# Patient Record
Sex: Male | Born: 1960 | Race: White | Hispanic: No | Marital: Married | Smoking: Never smoker
Health system: Southern US, Community
[De-identification: ages and names within clinical notes are randomized; demographics above are authoritative.]

## PROBLEM LIST (undated history)

## (undated) DIAGNOSIS — K649 Unspecified hemorrhoids: Secondary | ICD-10-CM

## (undated) DIAGNOSIS — D126 Benign neoplasm of colon, unspecified: Secondary | ICD-10-CM

## (undated) DIAGNOSIS — M109 Gout, unspecified: Secondary | ICD-10-CM

## (undated) DIAGNOSIS — K449 Diaphragmatic hernia without obstruction or gangrene: Secondary | ICD-10-CM

## (undated) DIAGNOSIS — E785 Hyperlipidemia, unspecified: Secondary | ICD-10-CM

## (undated) DIAGNOSIS — N200 Calculus of kidney: Secondary | ICD-10-CM

## (undated) DIAGNOSIS — I10 Essential (primary) hypertension: Secondary | ICD-10-CM

## (undated) DIAGNOSIS — K2980 Duodenitis without bleeding: Secondary | ICD-10-CM

## (undated) DIAGNOSIS — E349 Endocrine disorder, unspecified: Secondary | ICD-10-CM

## (undated) DIAGNOSIS — R748 Abnormal levels of other serum enzymes: Secondary | ICD-10-CM

## (undated) HISTORY — DX: Endocrine disorder, unspecified: E34.9

## (undated) HISTORY — DX: Calculus of kidney: N20.0

## (undated) HISTORY — DX: Gout, unspecified: M10.9

## (undated) HISTORY — DX: Hyperlipidemia, unspecified: E78.5

## (undated) HISTORY — DX: Diaphragmatic hernia without obstruction or gangrene: K44.9

## (undated) HISTORY — DX: Duodenitis without bleeding: K29.80

## (undated) HISTORY — DX: Abnormal levels of other serum enzymes: R74.8

## (undated) HISTORY — DX: Benign neoplasm of colon, unspecified: D12.6

## (undated) HISTORY — PX: VASECTOMY: SHX75

## (undated) HISTORY — PX: LITHOTRIPSY: SUR834

## (undated) HISTORY — DX: Unspecified hemorrhoids: K64.9

---

## 2006-01-28 HISTORY — PX: COLONOSCOPY: SHX174

## 2006-06-14 ENCOUNTER — Ambulatory Visit: Payer: Self-pay | Admitting: Cardiology

## 2006-08-16 ENCOUNTER — Ambulatory Visit: Payer: Self-pay | Admitting: Cardiology

## 2010-01-21 ENCOUNTER — Ambulatory Visit (HOSPITAL_COMMUNITY): Admission: RE | Admit: 2010-01-21 | Discharge: 2010-01-21 | Payer: Self-pay | Admitting: Family Medicine

## 2010-09-03 NOTE — Assessment & Plan Note (Signed)
[facility]                            CARDIOLOGY OFFICE NOTE   Jorge [name], Jorge [name]                         MRN:          [id number]  DATE:06/14/2006                            DOB:          04/17/1961    REFERRING PHYSICIAN:  Paulene Floor, NP   REASON FOR REFERRAL:  Evaluate patient with palpitations.   HISTORY OF PRESENT ILLNESS:  The patient is a 50 year old gentleman  without prior cardiac history. He says that last week he noticed  butterflies. This felt like a fluttering in his upper epigastric area.  He had had this for 3-4 days before presenting to [place].  There an EKG demonstrated sinus tachycardia with a heart rate of 100.  There were no other acute abnormalities. He had no presyncope or  syncope. He had no shortness of breath. He denies any chest discomfort,  neck discomfort, arm discomfort, activity-induced nausea, vomiting,  excessive diaphoresis. He never had palpitations like this before. The  patient also was having some problems with his blood pressure being  elevated.   PAST MEDICAL HISTORY:  He has had no prior history of hypertension,  diabetes or hyperlipidemia.   PAST SURGICAL HISTORY:  Vasectomy.   ALLERGIES:  PENICILLIN.   MEDICATIONS:  Metoprolol 25 mg daily.   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 He has one 75-  year-old son. He does not smoke cigarettes. He rarely drinks alcohol.   FAMILY HISTORY:  Noncontributory for early coronary artery disease,  sudden cardiac death, congestive heart failure, arrhythmias, syncope.  His mother does have valve problems. However, she is alive at 59.   REVIEW OF SYSTEMS:  Positive for rare headaches. Negative for all other  systems.   PHYSICAL EXAMINATION:  GENERAL:  The patient is in no distress.  VITAL SIGNS:  Blood pressure 139/86, heart rate 82 and regular, weight  148 pounds, body mass index 24.  HEENT:  Eyelids unremarkable. Pupils equal round  and reactive to light.  Fundi not visualized. Oral mucosa unremarkable.  NECK:  No jugular venous distention, wave form within normal limits,  carotid upstroke brisk and symmetric, no bruits, no thyromegaly.  LYMPHATICS:  No cervical, axillary or inguinal adenopathy.  LUNGS:  Clear to auscultation bilaterally.  BACK:  No costovertebral angle tenderness.  CHEST:  Unremarkable.  HEART:  PMI not displaced or sustained, S1 and S2 within normal limits,  no S3, no S4, no clicks, no rubs, no murmurs.  ABDOMEN:  Flat, positive bowel sounds, normal to frequency and pitch, no  bruits, no rebound, no guarding, no midline pulsatile mass, no  hepatomegaly or splenomegaly.  SKIN:  No rashes, no nodules.  EXTREMITIES:  2+ pulses throughout, no edema, no cyanosis, no clubbing.  NEUROLOGIC:  Oriented to person, placed and time. Cranial nerves II-XII  grossly intact. Motor grossly intact.   EKG sinus rhythm, rate 82, axis within normal limits, interval is within  normal limits. No acute ST-T wave changes. R waves are prime in V1 and  V2.   ASSESSMENT/PLAN:  1. Tachycardia. The patient is having  palpitations that appear to be      sinus. I am going to apply a 2-week event monitor. I am also going      to check a BMET, TSH and magnesium. Further evaluation will be      based on these results. I have also instructed him to stop drinking      caffeine as he does drink a couple of caffeinated beverages every      day. He will continue the metoprolol for now.  2. Followup will be in 4 weeks in [place].     Rollene Rotunda, MD, [facility]  Electronically Signed    JH/MedQ  DD: 06/14/2006  DT: 06/14/2006  Job #: [id number]   cc:   Ernestina Penna, M.D.

## 2010-09-03 NOTE — Assessment & Plan Note (Signed)
[facility]                            CARDIOLOGY OFFICE NOTE   Jorge, [name]                         MRN:          [id number]  DATE:08/16/2006                            DOB:          03/22/1961    PRIMARY CARE PHYSICIAN:  Paulene Floor   REASON FOR PRESENTATION:  Evaluate patient with palpitations.   HISTORY OF PRESENT ILLNESS:  The patient presents for followup. Since  the last visit, he did wear an event monitor. He had some fluttering in  his chest and a funny feeling in his stomach, these were his symptoms.  He pressed the monitor, but had only normal sinus rhythm recorded. He  did stop drinking caffeine and his symptoms went away. He has also been  taking 25 mg of metoprolol. He thinks the metoprolol is interfering with  his sleep and making him sluggish. He is not having pre-syncope or  syncope. He is not having any chest pain. He actually restarted caffeine  recently and still has not been bothered. He is using a much lower dose  than he was before.   PAST MEDICAL HISTORY:  Vasectomy.   ALLERGIES:  PENICILLIN.   MEDICATIONS:  Metoprolol 25 mg daily.   REVIEW OF SYSTEMS:  As stated in the HPI and otherwise negative for  other systems.   PHYSICAL EXAMINATION:  The patient is in no distress. Blood pressure is  124/78, heart rate 79 and regular, weight is 149 pounds.  LUNGS:  Clear to auscultation bilaterally.  HEART: PMI not displaced or sustained, S1 and S2 within normal limits.  No S3, S4. No clicks, rubs, or murmurs.  ABDOMEN: Flat, positive bowel sounds. Normal in frequency and pitch, no  bruits and no rebound. No guarding. No midline pulsatile mass. No  organomegaly.  SKIN: No rashes. No nodules.  EXTREMITIES: 2+ pulses. No edema.   ASSESSMENT/PLAN:  1. Palpitations. At this point, I think the patient can stop his beta-      blocker. There is no clear evidence that his symptoms are related      to any kind of arrhythmia. It  could be GI. I have advised him that      if his palpitations return he needs to stop caffeine again      completely. If this does not work, he could restart the beta-      blocker as we may not have captured palpitations on the monitor. If      it gets worse he will let me know. Of note, he did have normal      chemistry      and thyroid.  2. Followup will be as needed.     Jorge Rotunda, MD, [facility]  Electronically Signed    JH/MedQ  DD: 08/16/2006  DT: 08/16/2006  Job #: [id number]   cc:   Paulene Floor, FNP

## 2011-03-31 ENCOUNTER — Other Ambulatory Visit: Payer: Self-pay

## 2011-03-31 ENCOUNTER — Encounter: Payer: Self-pay | Admitting: Emergency Medicine

## 2011-03-31 ENCOUNTER — Emergency Department (HOSPITAL_COMMUNITY): Payer: BC Managed Care – PPO

## 2011-03-31 ENCOUNTER — Emergency Department (HOSPITAL_COMMUNITY)
Admission: EM | Admit: 2011-03-31 | Discharge: 2011-03-31 | Disposition: A | Discharge status: Home / Self Care | Payer: BC Managed Care – PPO | Attending: Emergency Medicine | Admitting: Emergency Medicine

## 2011-03-31 DIAGNOSIS — R079 Chest pain, unspecified: Secondary | ICD-10-CM | POA: Insufficient documentation

## 2011-03-31 DIAGNOSIS — I160 Hypertensive urgency: Secondary | ICD-10-CM

## 2011-03-31 DIAGNOSIS — R0989 Other specified symptoms and signs involving the circulatory and respiratory systems: Secondary | ICD-10-CM | POA: Insufficient documentation

## 2011-03-31 DIAGNOSIS — R0609 Other forms of dyspnea: Secondary | ICD-10-CM | POA: Insufficient documentation

## 2011-03-31 DIAGNOSIS — Z79899 Other long term (current) drug therapy: Secondary | ICD-10-CM | POA: Insufficient documentation

## 2011-03-31 DIAGNOSIS — I10 Essential (primary) hypertension: Secondary | ICD-10-CM | POA: Insufficient documentation

## 2011-03-31 HISTORY — DX: Essential (primary) hypertension: I10

## 2011-03-31 LAB — CBC
MCH: 31.8 pg (ref 26.0–34.0)
MCHC: 34.7 g/dL (ref 30.0–36.0)
MCV: 91.7 fL (ref 78.0–100.0)
Platelets: 167 10*3/uL (ref 150–400)
RDW: 12.2 % (ref 11.5–15.5)
WBC: 13 10*3/uL — ABNORMAL HIGH (ref 4.0–10.5)

## 2011-03-31 LAB — URINALYSIS, ROUTINE W REFLEX MICROSCOPIC
Bilirubin Urine: NEGATIVE
Hgb urine dipstick: NEGATIVE
Ketones, ur: NEGATIVE mg/dL
Nitrite: NEGATIVE
Protein, ur: NEGATIVE mg/dL
Urobilinogen, UA: 0.2 mg/dL (ref 0.0–1.0)

## 2011-03-31 LAB — BASIC METABOLIC PANEL
Calcium: 9.1 mg/dL (ref 8.4–10.5)
Chloride: 106 mEq/L (ref 96–112)
Creatinine, Ser: 0.98 mg/dL (ref 0.50–1.35)
GFR calc Af Amer: >90 mL/min (ref >90)
GFR calc non Af Amer: >90 mL/min (ref >90)

## 2011-03-31 LAB — TROPONIN I: Troponin I: <0.3 ng/mL (ref <0.30)

## 2011-03-31 NOTE — ED Notes (Signed)
MD gave approval for patient to eat a normal diet tray.  PT offered meal but didn't want anything now.

## 2011-03-31 NOTE — ED Notes (Signed)
Patient at work developed chest pressure and shortness of breath with high blood pressure while applying shrink wrap to a pallet.  Seen at urgent care sent to ED for evaluation.  Aspirin and one nitro sl given at urgent care. Patient denies chest pressure.

## 2011-03-31 NOTE — ED Provider Notes (Signed)
History     CSN: [id number] Arrival date & time: 03/31/2011  2:42 PM   First MD Initiated Contact with Patient 03/31/11 1505      Chief Complaint  Patient presents with  . Chest Pain    (Consider location/radiation/quality/duration/timing/severity/associated sxs/prior treatment) The history is provided by the patient and the spouse.   Patient is a 50 year old male with a history of hypertension who presents with chest pain.  About 2 and half hours prior to arrival, patient was wrapping a palate with plaster. At that time he started developing retrosternal pressure and mild associated dyspnea. He also states he "did not feel right".  He symptoms lasted for about one half hour. There were constant and resolved on their own.. They did not improve when he stopped wrapping the palate.  Patient was checked by nearby clinic and was found to be hypertensive with pressure 190s over low 100s. With this hypertension he noted the symptoms. Since then his blood pressure has improved and he notes complete resolution of the symptoms. He feels at baseline my interview and emergency department. He denied associated lightheadedness, syncope, palpitations. He denies this happening before. He typically has well-controlled hypertension and is on diastolic. He has not missed any recent doses. He denies any over-the-counter medications. He also denies any dietary indiscretion. No illicit drug use.  Past Medical History  Diagnosis Date  . Hypertension     History reviewed. No pertinent past surgical history.  No family history on file. Father had MI in his late 72s or early 25s Mother with mitral valve disorder History  Substance Use Topics  . Smoking status: Never Smoker   . Smokeless tobacco: Not on file  . Alcohol Use: No      Review of Systems  Constitutional: Negative for fever, chills and activity change.  HENT: Negative for congestion and neck pain.   Respiratory: Negative for cough and  wheezing.   Cardiovascular: Negative for chest pain.  Gastrointestinal: Negative for nausea, vomiting, abdominal pain, diarrhea and abdominal distention.  Genitourinary: Negative for difficulty urinating.  Musculoskeletal: Negative for gait problem.  Skin: Negative for rash.  Neurological: Negative for weakness and numbness.  Psychiatric/Behavioral: Negative for behavioral problems and confusion.  All other systems reviewed and are negative.    Allergies  Penicillins  Home Medications   Current Outpatient Rx  Name Route Sig Dispense Refill  . VITAMIN D 1000 UNITS PO TABS Oral Take 2,000 Units by mouth daily.      . OMEGA-3 FATTY ACIDS 1000 MG PO CAPS Oral Take 2,400 mg by mouth daily.      . IBUPROFEN 200 MG PO TABS Oral Take 400 mg by mouth every 6 (six) hours as needed. For pain     . NEBIVOLOL HCL 5 MG PO TABS Oral Take 1.66 mg by mouth daily.     [name] Kitchen ROSUVASTATIN CALCIUM 40 MG PO TABS Oral Take 20 mg by mouth daily.      . TESTOSTERONE 20.25 MG/ACT (1.62%) TD GEL Transdermal Place 2 application onto the skin daily.        BP 131/74  Pulse 76  Temp(Src) 97.4 F (36.3 C) (Oral)  Resp 18  SpO2 97%  Physical Exam  Nursing note and vitals reviewed. Constitutional: He is oriented to person, place, and time. He appears well-developed and well-nourished. No distress.  HENT:  Head: Normocephalic.  Nose: Nose normal.  Eyes: EOM are normal. Pupils are equal, round, and reactive to light.  Neck: Normal range  of motion. Neck supple. No JVD present.  Cardiovascular: Normal rate, regular rhythm and intact distal pulses.   No murmur heard. Pulmonary/Chest: Effort normal and breath sounds normal. No respiratory distress. He exhibits no tenderness.  Abdominal: Soft. Bowel sounds are normal. He exhibits no distension. There is no tenderness.  Musculoskeletal: Normal range of motion. He exhibits no edema and no tenderness.       No calf ttp  Neurological: He is alert and oriented to  person, place, and time.       Normal strength  Skin: Skin is warm and dry. He is not diaphoretic.  Psychiatric: He has a normal mood and affect. His behavior is normal. Thought content normal.    ED Course  Procedures (including critical care time)   Date: 03/31/2011  Rate: 79  Rhythm: normal sinus rhythm  QRS Axis: normal  Intervals: normal  ST/T Wave abnormalities: normal  Conduction Disutrbances:none  Narrative Interpretation: normal EKG  Old EKG Reviewed: none available   Labs Reviewed  CBC - Abnormal; Notable for the following:    WBC 13.0 (*)    All other components within normal limits  BASIC METABOLIC PANEL  TROPONIN I  URINALYSIS, ROUTINE W REFLEX MICROSCOPIC   Dg Chest 2 View  03/31/2011  *RADIOLOGY REPORT*  Clinical Data: Chest pain, hypertension  CHEST - 2 VIEW  Comparison: None.  Findings: The cardiac silhouette, mediastinum, pulmonary vasculature are within normal limits.  There is an azygos lobe which exhibits diffuse increased opacity.  The left lung is clear. There is no acute bony abnormality.  IMPRESSION:  Question fluid or adenopathy in the right paratracheal region within the azygos lobe.  CT scan of the chest may be helpful for better characterization unless there are outside prior films for comparison purposes.  Original Report Authenticated By: Brandon Melnick, M.D.     1. Hypertensive urgency       MDM   Patient with chest pressure and shortness of breath and setting of hypertension. No recent exertional chest pain or dyspnea. Suspect hypertensive urgency. EKG is negative for acute ischemia and hypertrophy. Patient is well appearing with normal vital signs and asymptomatic in ED. Will evaluate for evidence of endorgan damage. Also closely monitor blood pressure. Patient has risk factors for coronary disease including hypertension (very well controlled) and dyslipidemia. He does not smoke. No premature onset of cardiac disease in family.  Lab work  unremarkable. Patient closely monitored and did not have any additional chest pain while in ED. Normal tensive. Clinical picture is consistent with hypertensive urgency. There is no evidence of endorgan damage. Incidental note on chest x-ray relayed to patient. He'll followup with his PCP for outpatient CT to further characterize this. No evidence that this is contributing to patient's symptoms. Return precautions discussed.        Milus Glazier 04/01/11 0016

## 2011-03-31 NOTE — ED Provider Notes (Signed)
4:45 PM  I performed a history and physical examination of Jorge [name] and discussed his management with Dr. Zebedee Iba.  I agree with the history, physical, assessment, and plan of care, with the following exceptions: None  The patient is presenting for evaluation of an episode of dyspnea and chest pressure associated with hypertension that is now resolved. He is in no respiratory distress and breathing easily with good air exchange heard in all lung fields and no wheezes, rales, or rhonchi, normal heart sounds with a normal S1 and S2 no murmur, rub, or gallop. The patient's blood pressure is at this time controlled.  I was present for the following procedures: None Time Spent in Critical Care of the patient: None Time spent in discussions with the patient and family: 5 minutes  Jorge [name] D    Jorge Bonier, MD 03/31/11 [phone]

## 2011-04-01 NOTE — ED Provider Notes (Signed)
  I performed a history and physical examination of Jorge [name] and discussed his management with Dr. Zebedee Iba.  I agree with the history, physical, assessment, and plan of care, with the following exceptions: None  Pt is a 50 year old male with chest pain, hypertensive urgency, both now resolved.  Lungs are clear to auscultation in all fields w/o rales, rhonchi, wheezes, and heart sounds are normal with regular rate and rhythm, no peripheral edema.  I was present for the following procedures: None Time Spent in Critical Care of the patient: None Time spent in discussions with the patient and family: 5 minutes Earl Losee D    Felisa Bonier, MD 04/01/11 1100

## 2011-04-22 ENCOUNTER — Other Ambulatory Visit: Payer: Self-pay | Admitting: Family Medicine

## 2011-04-22 DIAGNOSIS — R9389 Abnormal findings on diagnostic imaging of other specified body structures: Secondary | ICD-10-CM

## 2011-04-26 ENCOUNTER — Ambulatory Visit
Admission: RE | Admit: 2011-04-26 | Discharge: 2011-04-26 | Disposition: A | Discharge status: Home / Self Care | Payer: BC Managed Care – PPO | Source: Ambulatory Visit | Attending: Family Medicine | Admitting: Family Medicine

## 2011-04-26 DIAGNOSIS — R9389 Abnormal findings on diagnostic imaging of other specified body structures: Secondary | ICD-10-CM

## 2011-04-26 MED ORDER — IOHEXOL 300 MG/ML  SOLN
75.0000 mL | Freq: Once | INTRAMUSCULAR | Status: AC | PRN
  Administered 2011-04-26: 75 mL via INTRAVENOUS

## 2012-10-31 ENCOUNTER — Ambulatory Visit (INDEPENDENT_AMBULATORY_CARE_PROVIDER_SITE_OTHER): Payer: BC Managed Care – PPO | Admitting: Family Medicine

## 2012-10-31 ENCOUNTER — Encounter: Payer: Self-pay | Admitting: Family Medicine

## 2012-10-31 VITALS — BP 124/74 | HR 67 | Temp 97.7°F | Ht 64.0 in | Wt 163.2 lb

## 2012-10-31 DIAGNOSIS — I10 Essential (primary) hypertension: Secondary | ICD-10-CM

## 2012-10-31 DIAGNOSIS — E785 Hyperlipidemia, unspecified: Secondary | ICD-10-CM

## 2012-10-31 DIAGNOSIS — E291 Testicular hypofunction: Secondary | ICD-10-CM

## 2012-10-31 DIAGNOSIS — E559 Vitamin D deficiency, unspecified: Secondary | ICD-10-CM

## 2012-10-31 DIAGNOSIS — E349 Endocrine disorder, unspecified: Secondary | ICD-10-CM | POA: Insufficient documentation

## 2012-10-31 NOTE — Progress Notes (Signed)
  Subjective:    Patient ID: Jorge [name], male    DOB: 04-04-1961, 52 y.o.   MRN: [id number]  HPI Patient returns to clinic today for followup and management of his chronic medical problems which include hypertension hyperlipidemia and testosterone deficiency. Labs were drawn today.    Review of Systems  Constitutional: Positive for fatigue (slight).  HENT: Negative.   Eyes: Negative.   Respiratory: Negative.   Cardiovascular: Negative.   Gastrointestinal: Negative.   Endocrine: Negative.   Genitourinary: Negative.   Musculoskeletal: Positive for arthralgias (L shoulder improving).  Skin: Negative.   Allergic/Immunologic: Positive for environmental allergies (seasonal).  Neurological: Negative.   Hematological: Negative.   Psychiatric/Behavioral: Negative.        Objective:   Physical Exam BP 124/74  Pulse 67  Temp(Src) 97.7 F (36.5 C) (Oral)  Ht 5\' 4"  (1.626 m)  Wt 163 lb 3.2 oz (74.027 kg)  BMI 28 kg/m2  The patient appeared well nourished and normally developed, alert and oriented to time and place. Speech, behavior and judgement appear normal. Vital signs as documented.  Head exam is unremarkable. No scleral icterus or pallor noted.  Neck is without jugular venous distension, thyromegally, or carotid bruits. Carotid upstrokes are brisk bilaterally. No cervical adenopathy. Lungs are clear anteriorly and posteriorly to auscultation. Normal respiratory effort. Cardiac exam reveals regular rate and rhythm at 60 per minute. First and second heart sounds normal.  No murmurs, rubs or gallops.  Abdominal exam reveals normal bowl sounds, no masses, no organomegaly and no aortic enlargement. No inguinal adenopathy. Prostate exam today revealed a slightly enlarged but smooth prostate. There were no lumps. There were no rectal masses. Testicles were normal without hernias. Extremities are nonedematous and both femoral pulses are normal. Skin without pallor or jaundice.  Warm  and dry, without rash. He has some healing areas of folliculitis on his buttocks Neurologic exam reveals normal deep tendon reflexes and normal sensation.          Assessment & Plan:  1. Hyperlipidemia - NMR Lipoprofile with Lipids; Standing - Hepatic function panel; Standing  2. Hypertension - POCT CBC; Standing - BASIC METABOLIC PANEL WITH GFR; Standing  3. Testosterone deficiency - POCT urinalysis dipstick - POCT UA - Microscopic Only - PSA; Standing - Testosterone, Total & Free Direct; Standing  4. Vitamin D deficiency - Vitamin D 25 hydroxy; Standing  Patient Instructions  Continue current meds and therapeutic lifestyle changes We will call you with the results once they are available   Nyra Capes MD

## 2012-10-31 NOTE — Patient Instructions (Addendum)
Continue current meds and therapeutic lifestyle changes We will call you with the results once they are available

## 2012-11-01 ENCOUNTER — Other Ambulatory Visit (INDEPENDENT_AMBULATORY_CARE_PROVIDER_SITE_OTHER): Payer: BC Managed Care – PPO

## 2012-11-01 DIAGNOSIS — I10 Essential (primary) hypertension: Secondary | ICD-10-CM

## 2012-11-01 DIAGNOSIS — E559 Vitamin D deficiency, unspecified: Secondary | ICD-10-CM

## 2012-11-01 DIAGNOSIS — E785 Hyperlipidemia, unspecified: Secondary | ICD-10-CM

## 2012-11-01 DIAGNOSIS — E349 Endocrine disorder, unspecified: Secondary | ICD-10-CM

## 2012-11-01 LAB — POCT CBC
Granulocyte percent: 65.5 %G (ref 37–80)
Hemoglobin: 16 g/dL (ref 14.1–18.1)
MCH, POC: 32.2 pg — AB (ref 27–31.2)
MCV: 90.2 fL (ref 80–97)
RBC: 5 M/uL (ref 4.69–6.13)

## 2012-11-01 LAB — BASIC METABOLIC PANEL WITH GFR
CO2: 28 mEq/L (ref 19–32)
Calcium: 9.5 mg/dL (ref 8.4–10.5)
Chloride: 104 mEq/L (ref 96–112)
Creat: 1.03 mg/dL (ref 0.50–1.35)
GFR, Est Non African American: 84 mL/min
Sodium: 141 mEq/L (ref 135–145)

## 2012-11-01 LAB — HEPATIC FUNCTION PANEL
Albumin: 4.3 g/dL (ref 3.5–5.2)
Alkaline Phosphatase: 57 U/L (ref 39–117)
Total Bilirubin: 0.5 mg/dL (ref 0.3–1.2)
Total Protein: 6.4 g/dL (ref 6.0–8.3)

## 2012-11-01 NOTE — Progress Notes (Signed)
Pt came in for labs only 

## 2012-11-02 LAB — NMR LIPOPROFILE WITH LIPIDS
HDL Particle Number: 30.4 umol/L — ABNORMAL LOW (ref >=30.5)
HDL Size: 8.6 nm — ABNORMAL LOW (ref >=9.2)
HDL-C: 34 mg/dL — ABNORMAL LOW (ref >=40)
LDL (calc): 38 mg/dL (ref <100)
LDL Size: 19.9 nm — ABNORMAL LOW (ref >20.5)
LP-IR Score: 74 — ABNORMAL HIGH (ref <=45)
Large HDL-P: <1.3 umol/L — ABNORMAL LOW (ref >=4.8)
Triglycerides: 118 mg/dL (ref <150)

## 2012-11-02 LAB — PSA: PSA: 1.5 ng/mL (ref <=4.00)

## 2012-11-02 LAB — TESTOSTERONE, TOTAL AND FREE DIRECT MEASURE

## 2012-11-02 LAB — VITAMIN D 25 HYDROXY (VIT D DEFICIENCY, FRACTURES): Vit D, 25-Hydroxy: 57 ng/mL (ref 30–89)

## 2012-11-06 NOTE — Addendum Note (Signed)
Addended by: Orma Render F on: 11/06/2012 04:23 PM   Modules accepted: Orders

## 2012-11-06 NOTE — Addendum Note (Signed)
Addended by: Orma Render F on: 11/06/2012 04:25 PM   Modules accepted: Orders

## 2012-11-08 LAB — TESTOSTERONE, TOTAL AND FREE DIRECT MEASURE
Free Testosterone, Direct: 9.5 pg/mL (ref 3.8–34.2)
Testosterone: 278 ng/dL — ABNORMAL LOW (ref 300–890)

## 2012-11-13 ENCOUNTER — Telehealth: Payer: Self-pay | Admitting: *Deleted

## 2012-11-13 NOTE — Telephone Encounter (Signed)
Message copied by Bearl Mulberry on Tue Nov 13, 2012 12:32 PM ------      Message from: Ernestina Penna      Created: Fri Nov 02, 2012  9:30 PM       The total LDL [place] number was at goal at 751. Goal is less than 1000. The LDL C. was good. The HDL particle number, which is the good cholesterol was low.--- Continue current treatment and aggressive therapeutic lifestyle change      The vitamin D level is good      Electrolytes are good. Blood sugar slightly increased at 101. Kidney function tests are good.      The PSA is low and good at 1.50.      All liver function tests within normal limit      The total testosterone level was slightly decreased      The free direct testosterone test was not performed++++++++ please have the lab get this done!!!!!!!---- there will be no change in treatment as patient is doing well and feeling well ------

## 2012-11-13 NOTE — Telephone Encounter (Signed)
Pt notified of results

## 2012-12-04 ENCOUNTER — Other Ambulatory Visit (INDEPENDENT_AMBULATORY_CARE_PROVIDER_SITE_OTHER): Payer: BC Managed Care – PPO

## 2012-12-04 DIAGNOSIS — Z1212 Encounter for screening for malignant neoplasm of rectum: Secondary | ICD-10-CM

## 2012-12-07 ENCOUNTER — Encounter: Payer: Self-pay | Admitting: *Deleted

## 2013-01-02 ENCOUNTER — Other Ambulatory Visit: Payer: Self-pay | Admitting: *Deleted

## 2013-01-02 MED ORDER — NEBIVOLOL HCL 5 MG PO TABS: 5.0000 mg | ORAL_TABLET | Freq: Every day | ORAL | Status: DC

## 2013-01-02 NOTE — Telephone Encounter (Signed)
LAST RF 7/14. EPIC MED LIST HAD PT TAKE 1/2 QD BUT REFILL REQUEST WAS FOR ONE QD.

## 2013-03-07 ENCOUNTER — Ambulatory Visit (INDEPENDENT_AMBULATORY_CARE_PROVIDER_SITE_OTHER): Payer: PRIVATE HEALTH INSURANCE | Admitting: Family Medicine

## 2013-03-07 ENCOUNTER — Encounter (INDEPENDENT_AMBULATORY_CARE_PROVIDER_SITE_OTHER): Payer: Self-pay

## 2013-03-07 ENCOUNTER — Encounter: Payer: Self-pay | Admitting: Family Medicine

## 2013-03-07 VITALS — BP 146/81 | HR 76 | Temp 98.9°F | Ht 64.0 in | Wt 163.0 lb

## 2013-03-07 DIAGNOSIS — E559 Vitamin D deficiency, unspecified: Secondary | ICD-10-CM

## 2013-03-07 DIAGNOSIS — E785 Hyperlipidemia, unspecified: Secondary | ICD-10-CM

## 2013-03-07 DIAGNOSIS — E349 Endocrine disorder, unspecified: Secondary | ICD-10-CM

## 2013-03-07 DIAGNOSIS — I10 Essential (primary) hypertension: Secondary | ICD-10-CM

## 2013-03-07 DIAGNOSIS — B029 Zoster without complications: Secondary | ICD-10-CM

## 2013-03-07 DIAGNOSIS — E291 Testicular hypofunction: Secondary | ICD-10-CM

## 2013-03-07 MED ORDER — ROSUVASTATIN CALCIUM 40 MG PO TABS: 40.0000 mg | ORAL_TABLET | Freq: Every day | ORAL | Status: DC

## 2013-03-07 MED ORDER — NEBIVOLOL HCL 5 MG PO TABS: 5.0000 mg | ORAL_TABLET | Freq: Every day | ORAL | Status: DC

## 2013-03-07 MED ORDER — VALACYCLOVIR HCL 1 G PO TABS: 1000.0000 mg | ORAL_TABLET | Freq: Three times a day (TID) | ORAL | Status: DC

## 2013-03-07 MED ORDER — TESTOSTERONE 20.25 MG/ACT (1.62%) TD GEL: 2.0000 "application " | Freq: Every day | TRANSDERMAL | Status: DC

## 2013-03-07 NOTE — Progress Notes (Signed)
Subjective:    Patient ID: Jorge [name], male    DOB: 08/16/1960, 52 y.o.   MRN: [id number]  HPI Pt here for follow up and management of chronic medical problems. Patient does complain of some headaches. He also indicates that his blood pressures have been running slightly higher. He is still using the AndroGel regularly, 2 pumps once daily. 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 He has been under more stress. He has had some neck pain and has taken some anti-inflammatory medications over-the-counter. He does straight some caffeine and carbonated beverages. He recently developed a nonpainful rash on his left flank this started 2 days ago. He notices it tingles and he is aware of this they are but once again there is no pain associated with this.     Patient Active Problem List   Diagnosis Date Noted  . Hyperlipidemia 10/31/2012  . Hypertension 10/31/2012  . Testosterone deficiency 10/31/2012  . Vitamin D deficiency 10/31/2012   Outpatient Encounter Prescriptions as of 03/07/2013  Medication Sig  . cholecalciferol (VITAMIN D) 1000 UNITS tablet Take 2,000 Units by mouth daily.    . fish oil-omega-3 fatty acids 1000 MG capsule Take 2,400 mg by mouth daily.    [name] Kitchen ibuprofen (ADVIL,MOTRIN) 200 MG tablet Take 400 mg by mouth every 6 (six) hours as needed. For pain   . nebivolol (BYSTOLIC) 5 MG tablet Take 1 tablet (5 mg total) by mouth daily.  . rosuvastatin (CRESTOR) 40 MG tablet Take 20 mg by mouth daily.    . Testosterone (ANDROGEL PUMP) 20.25 MG/ACT (1.62%) GEL Place 2 application onto the skin daily.      Review of Systems  Constitutional: Negative.   Eyes: Negative.   Respiratory: Negative.   Cardiovascular: Negative.   Gastrointestinal: Negative.   Endocrine: Negative.   Genitourinary: Negative.   Musculoskeletal: Negative.   Skin: Positive for rash (low back rash- first noticed 1.5 days ago).  Allergic/Immunologic: Negative.   Neurological:  Positive for headaches (BP running higher). Negative for dizziness.  Hematological: Negative.   Psychiatric/Behavioral: Negative.        Objective:   Physical Exam  Nursing note and vitals reviewed. Constitutional: He is oriented to person, place, and time. He appears well-developed and well-nourished. No distress.  HENT:  Head: Normocephalic and atraumatic.  Right Ear: External ear normal.  Left Ear: External ear normal.  Nose: Nose normal.  Mouth/Throat: Oropharynx is clear and moist. No oropharyngeal exudate.  Eyes: Conjunctivae and EOM are normal. Pupils are equal, round, and reactive to light. Right eye exhibits no discharge. Left eye exhibits no discharge. No scleral icterus.  Neck: Normal range of motion. Neck supple. No tracheal deviation present. No thyromegaly present.  Cardiovascular: Normal rate, regular rhythm, normal heart sounds and intact distal pulses.  Exam reveals no gallop and no friction rub.   No murmur heard. Pulmonary/Chest: Effort normal and breath sounds normal. No respiratory distress. He has no wheezes. He has no rales. He exhibits no tenderness.  Abdominal: Soft. Bowel sounds are normal. He exhibits no mass. There is no tenderness. There is no rebound and no guarding.  Musculoskeletal: Normal range of motion. He exhibits no edema and no tenderness.  Lymphadenopathy:    He has no cervical adenopathy.  Neurological: He is alert and oriented to person, place, and time. He has normal reflexes. No cranial nerve deficit.  Skin: Skin is warm and dry. No rash noted. No erythema. No pallor.  Psychiatric:  He has a normal mood and affect. His behavior is normal. Judgment and thought content normal.   BP 146/81  Pulse 76  Temp(Src) 98.9 F (37.2 C) (Oral)  Ht 5\' 4"  (1.626 m)  Wt 163 lb (73.936 kg)  BMI 27.97 kg/m2        Assessment & Plan:   1. Hypertension   2. Hyperlipidemia   3. Testosterone deficiency   4. Vitamin D deficiency   5. Shingles outbreak     Orders Placed This Encounter  Procedures  . DG Chest 2 View    Standing Status: Future     Number of Occurrences:      Standing Expiration Date: 05/07/2014    Order Specific Question:  Reason for Exam (SYMPTOM  OR DIAGNOSIS REQUIRED)    Answer:  htn    Order Specific Question:  Preferred imaging location?    Answer:  Internal  . Hepatic function panel    Standing Status: Future     Number of Occurrences:      Standing Expiration Date: 03/07/2014  . BMP8+EGFR    Standing Status: Future     Number of Occurrences:      Standing Expiration Date: 03/07/2014  . NMR, lipoprofile    Standing Status: Future     Number of Occurrences:      Standing Expiration Date: 03/07/2014  . Vit D  25 hydroxy (rtn osteoporosis monitoring)    Standing Status: Future     Number of Occurrences:      Standing Expiration Date: 03/07/2014  . POCT CBC    Standing Status: Future     Number of Occurrences:      Standing Expiration Date: 03/07/2014   Meds ordered this encounter  Medications  . Testosterone (ANDROGEL PUMP) 20.25 MG/ACT (1.62%) GEL    Sig: Place 2 application onto the skin daily.    Dispense:  150 g    Refill:  1  . rosuvastatin (CRESTOR) 40 MG tablet    Sig: Take 1 tablet (40 mg total) by mouth daily. Take one tab by mouth once a day as directed.    Dispense:  62 tablet    Refill:  3  . nebivolol (BYSTOLIC) 5 MG tablet    Sig: Take 1 tablet (5 mg total) by mouth daily.    Dispense:  62 tablet    Refill:  3   Valtrex 1 g 3 times daily for 10 days  Patient Instructions  Continue current medications. Continue good therapeutic lifestyle changes which include good diet and exercise. Fall precautions discussed with patient. If you are over 15 years old - you may need Prevnar 13 or the adult Pneumonia vaccine. Take medication as directed Try to drink more water and less caffeine and less carbonated beverages  Avoid NSAIDs Try to relax when your home Call in one week regarding the  rash that you have and let us make sure that it is getting better If your blood pressures remain elevated in the 140 range, add 2 1/2 milligrams after 2 weeks, which would be a total of 5 mg daily; during these blood pressures by for review in about 4 weeks Return to clinic for future lab work in January and a chest x-ray I will see you again sometime in March or April   Nyra Capes MD

## 2013-03-07 NOTE — Patient Instructions (Addendum)
Continue current medications. Continue good therapeutic lifestyle changes which include good diet and exercise. Fall precautions discussed with patient. If you are over 51 years old - you may need Prevnar 13 or the adult Pneumonia vaccine. Take medication as directed Try to drink more water and less caffeine and less carbonated beverages  Avoid NSAIDs Try to relax when your home Call in one week regarding the rash that you have and let us make sure that it is getting better If your blood pressures remain elevated in the 140 range, add 2 1/2 milligrams after 2 weeks, which would be a total of 5 mg daily; during these blood pressures by for review in about 4 weeks Return to clinic for future lab work in January and a chest x-ray I will see you again sometime in March or April

## 2013-03-07 NOTE — Addendum Note (Signed)
Addended by: Magdalene River on: 03/07/2013 04:35 PM   Modules accepted: Orders

## 2013-03-29 ENCOUNTER — Ambulatory Visit: Payer: PRIVATE HEALTH INSURANCE

## 2013-03-29 DIAGNOSIS — Z23 Encounter for immunization: Secondary | ICD-10-CM

## 2013-06-17 ENCOUNTER — Encounter: Payer: Self-pay | Admitting: *Deleted

## 2013-07-17 ENCOUNTER — Telehealth: Payer: Self-pay | Admitting: *Deleted

## 2013-07-17 ENCOUNTER — Telehealth: Payer: Self-pay | Admitting: Family Medicine

## 2013-07-17 ENCOUNTER — Ambulatory Visit (INDEPENDENT_AMBULATORY_CARE_PROVIDER_SITE_OTHER): Payer: PRIVATE HEALTH INSURANCE | Admitting: Family Medicine

## 2013-07-17 ENCOUNTER — Encounter: Payer: Self-pay | Admitting: Family Medicine

## 2013-07-17 VITALS — BP 147/87 | HR 73 | Temp 98.7°F | Ht 64.0 in | Wt 160.6 lb

## 2013-07-17 DIAGNOSIS — R52 Pain, unspecified: Secondary | ICD-10-CM

## 2013-07-17 DIAGNOSIS — R109 Unspecified abdominal pain: Secondary | ICD-10-CM

## 2013-07-17 DIAGNOSIS — N200 Calculus of kidney: Secondary | ICD-10-CM

## 2013-07-17 LAB — POCT URINALYSIS DIPSTICK
Bilirubin, UA: NEGATIVE
Glucose, UA: NEGATIVE
Ketones, UA: NEGATIVE
Leukocytes, UA: NEGATIVE
Nitrite, UA: NEGATIVE
Protein, UA: NEGATIVE
Spec Grav, UA: 1.025
Urobilinogen, UA: NEGATIVE
pH, UA: 5

## 2013-07-17 LAB — POCT UA - MICROSCOPIC ONLY
Casts, Ur, LPF, POC: NEGATIVE
Crystals, Ur, HPF, POC: NEGATIVE
Mucus, UA: NEGATIVE
Yeast, UA: NEGATIVE

## 2013-07-17 MED ORDER — TAMSULOSIN HCL 0.4 MG PO CAPS: 0.4000 mg | ORAL_CAPSULE | Freq: Every day | ORAL | Status: DC

## 2013-07-17 MED ORDER — HYDROCODONE-ACETAMINOPHEN 10-325 MG PO TABS: 1.0000 | ORAL_TABLET | Freq: Three times a day (TID) | ORAL | Status: DC | PRN

## 2013-07-17 NOTE — Telephone Encounter (Signed)
Pt appt given

## 2013-07-17 NOTE — Telephone Encounter (Signed)
RX should have been sent into [facility] in [place] of mail order [facility] Pt notified

## 2013-07-17 NOTE — Progress Notes (Signed)
   Subjective:    Patient ID: [name], male    DOB: 08/11/1960, 53 y.o.   MRN: [id number]  HPI  This 53 y.o. male presents for evaluation of right flank pain and colicky pain.  The pain is similar to The pain when he gets a kidney stone.  He has been having some problems with his flow at times But was able to void and provide a ua sample.  Review of Systems C/o right flank pain   No chest pain, SOB, HA, dizziness, vision change, N/V, diarrhea, constipation, dysuria, urinary urgency or frequency, myalgias, arthralgias or rash.  Objective:   Physical Exam  Vital signs noted  Well developed well nourished male.  HEENT - Head atraumatic Normocephalic                Eyes - PERRLA, Conjuctiva - clear Sclera- Clear EOMI                Ears - EAC's Wnl TM's Wnl Gross Hearing WNL                Nose - Nares patent                 Throat - oropharanx wnl Respiratory - Lungs CTA bilateral Cardiac - RRR S1 and S2 without murmur GI - Abdomen soft Nontender and bowel sounds active x 4 Extremities - No edema. Neuro - Grossly intact.      Assessment & Plan:  Flank pain, acute - Plan: POCT urinalysis dipstick, POCT UA - Microscopic Only, tamsulosin (FLOMAX) 0.4 MG CAPS capsule, HYDROcodone-acetaminophen (NORCO) 10-325 MG per tablet  Kidney stones - Plan: tamsulosin (FLOMAX) 0.4 MG CAPS capsule, HYDROcodone-acetaminophen (NORCO) 10-325 MG per tablet  Lysbeth Penner FNP

## 2013-07-22 ENCOUNTER — Telehealth: Payer: Self-pay | Admitting: Family Medicine

## 2013-07-23 ENCOUNTER — Telehealth: Payer: Self-pay | Admitting: Family Medicine

## 2013-07-23 NOTE — Telephone Encounter (Signed)
Wants to see about getting referral to urologist for kidney stone

## 2013-07-23 NOTE — Telephone Encounter (Signed)
U/A was done in office and should have been discussed at visit. There are no notes on the results but they do seem to correlate with diagnosis of kidney stone. Left this information on patient's voicemail per request.

## 2013-07-25 ENCOUNTER — Other Ambulatory Visit: Payer: Self-pay | Admitting: Family Medicine

## 2013-07-25 DIAGNOSIS — N200 Calculus of kidney: Secondary | ICD-10-CM

## 2013-07-25 NOTE — Telephone Encounter (Signed)
Spoke with pt and he is aware referral has been initiated . Advised pt to call back tomorrow if not heard from [place]

## 2013-07-25 NOTE — Telephone Encounter (Signed)
Consult to urology ordered urgent

## 2013-07-30 ENCOUNTER — Telehealth: Payer: Self-pay | Admitting: Family Medicine

## 2013-08-05 ENCOUNTER — Ambulatory Visit: Payer: PRIVATE HEALTH INSURANCE | Admitting: Family Medicine

## 2013-08-06 ENCOUNTER — Ambulatory Visit (INDEPENDENT_AMBULATORY_CARE_PROVIDER_SITE_OTHER): Payer: PRIVATE HEALTH INSURANCE | Admitting: Family Medicine

## 2013-08-06 ENCOUNTER — Other Ambulatory Visit: Payer: Self-pay | Admitting: Family Medicine

## 2013-08-06 ENCOUNTER — Ambulatory Visit (INDEPENDENT_AMBULATORY_CARE_PROVIDER_SITE_OTHER): Payer: PRIVATE HEALTH INSURANCE

## 2013-08-06 ENCOUNTER — Encounter: Payer: Self-pay | Admitting: Family Medicine

## 2013-08-06 VITALS — BP 156/90 | HR 72 | Temp 98.9°F | Ht 64.0 in | Wt 154.0 lb

## 2013-08-06 DIAGNOSIS — I10 Essential (primary) hypertension: Secondary | ICD-10-CM

## 2013-08-06 DIAGNOSIS — E291 Testicular hypofunction: Secondary | ICD-10-CM

## 2013-08-06 DIAGNOSIS — Z0000 Encounter for general adult medical examination without abnormal findings: Secondary | ICD-10-CM

## 2013-08-06 DIAGNOSIS — N201 Calculus of ureter: Secondary | ICD-10-CM

## 2013-08-06 DIAGNOSIS — E785 Hyperlipidemia, unspecified: Secondary | ICD-10-CM

## 2013-08-06 DIAGNOSIS — E559 Vitamin D deficiency, unspecified: Secondary | ICD-10-CM

## 2013-08-06 DIAGNOSIS — E349 Endocrine disorder, unspecified: Secondary | ICD-10-CM

## 2013-08-06 DIAGNOSIS — N400 Enlarged prostate without lower urinary tract symptoms: Secondary | ICD-10-CM

## 2013-08-06 DIAGNOSIS — E8881 Metabolic syndrome: Secondary | ICD-10-CM | POA: Insufficient documentation

## 2013-08-06 NOTE — Progress Notes (Signed)
Subjective:    Patient ID: Jorge [name], male    DOB: 12/08/1960, 53 y.o.   MRN: [id number]  HPI Pt here for follow up and management of chronic medical problems.         Patient Active Problem List   Diagnosis Date Noted  . Hyperlipidemia 10/31/2012  . Hypertension 10/31/2012  . Testosterone deficiency 10/31/2012  . Vitamin D deficiency 10/31/2012   Outpatient Encounter Prescriptions as of 08/06/2013  Medication Sig  . cholecalciferol (VITAMIN D) 1000 UNITS tablet Take 2,000 Units by mouth daily.    . fish oil-omega-3 fatty acids 1000 MG capsule Take 2,400 mg by mouth daily.    [name] Kitchen HYDROcodone-acetaminophen (NORCO) 10-325 MG per tablet Take 1 tablet by mouth every 8 (eight) hours as needed.  [name] Kitchen ibuprofen (ADVIL,MOTRIN) 200 MG tablet Take 400 mg by mouth every 6 (six) hours as needed. For pain   . nebivolol (BYSTOLIC) 5 MG tablet Take 1 tablet (5 mg total) by mouth daily.  . rosuvastatin (CRESTOR) 40 MG tablet Take 1 tablet (40 mg total) by mouth daily. Take one tab by mouth once a day as directed.  . tamsulosin (FLOMAX) 0.4 MG CAPS capsule Take 1 capsule (0.4 mg total) by mouth daily.  . Testosterone (ANDROGEL PUMP) 20.25 MG/ACT (1.62%) GEL Place 2 application onto the skin daily.    Review of Systems  Constitutional: Negative.   HENT: Negative.   Eyes: Negative.   Respiratory: Negative.   Cardiovascular: Negative.   Gastrointestinal: Negative.   Endocrine: Negative.   Genitourinary: Negative.        Kidney stone  Musculoskeletal: Negative.   Skin: Negative.   Allergic/Immunologic: Negative.   Neurological: Negative.   Hematological: Negative.   Psychiatric/Behavioral: Negative.    no fatigue or sexual dysfunction noted just stressed with the kidney stone that he is unable to pass.     Objective:   Physical Exam  Nursing note and vitals reviewed. Constitutional: He is oriented to person, place, and time. He appears well-developed and well-nourished. No distress.   HENT:  Head: Normocephalic and atraumatic.  Right Ear: External ear normal.  Left Ear: External ear normal.  Nose: Nose normal.  Mouth/Throat: Oropharynx is clear and moist. No oropharyngeal exudate.  Eyes: Conjunctivae and EOM are normal. Pupils are equal, round, and reactive to light. Right eye exhibits no discharge. Left eye exhibits no discharge. No scleral icterus.  Neck: Normal range of motion. Neck supple. No thyromegaly present.  No carotid bruits  Cardiovascular: Normal rate, regular rhythm, normal heart sounds and intact distal pulses.  Exam reveals no gallop and no friction rub.   No murmur heard. At 72 per minute  Pulmonary/Chest: Effort normal and breath sounds normal. He has no wheezes. He has no rales. He exhibits no tenderness.  No axillary nodes  Abdominal: Soft. Bowel sounds are normal. He exhibits no mass. There is no tenderness. There is no rebound and no guarding.  Genitourinary: Rectum normal and penis normal.  The prostate was slightly enlarged without any masses or nodules. There were no rectal masses. There is no inguinal hernia palpated. External genitalia were within normal limits.  Musculoskeletal: Normal range of motion. He exhibits no edema and no tenderness.  Lymphadenopathy:    He has no cervical adenopathy.  Neurological: He is alert and oriented to person, place, and time. He has normal reflexes. No cranial nerve deficit.  Skin: Skin is warm and dry. No rash noted. No erythema. No pallor.  Psychiatric: He  has a normal mood and affect. His behavior is normal. Judgment and thought content normal.   BP 156/90  Pulse 72  Temp(Src) 98.9 F (37.2 C) (Oral)  Ht '5\' 4"'  (1.626 m)  Wt 154 lb (69.854 kg)  BMI 26.42 kg/m2  [facility] reading (PRIMARY) by  Dr.Moore-chest x-ray- no active disease                                      Assessment & Plan:  1. Hyperlipidemia - POCT CBC; Future - NMR, lipoprofile; Future  2. Hypertension - POCT CBC; Future -  BMP8+EGFR; Future - Hepatic function panel; Future  3. Testosterone deficiency - POCT CBC; Future - PSA, total and free; Future - Testosterone,Free and Total; Future - POCT UA - Microscopic Only - POCT urinalysis dipstick  4. Vitamin D deficiency - POCT CBC; Future - Vit D  25 hydroxy (rtn osteoporosis monitoring); Future  5. Metabolic syndrome - POCT CBC; Future - POCT glycosylated hemoglobin (Hb A1C); Future  6. Right ureteral stone  7. Benign hypertrophy of prostate Patient Instructions  Continue current medications. Continue good therapeutic lifestyle changes which include good diet and exercise. Fall precautions discussed with patient. If an FOBT was given today- please return it to our front desk. If you are over 34 years old - you may need Prevnar 34 or the adult Pneumonia vaccine.  Return to clinic in a couple of weeks for fasting lab work including testosterone and PSA Check with her insurance regarding the Prevnar vaccine and a shingle shot Keep drinking plenty of fluids We will call him with the lab work results once those results are available Brain blood pressure readings in 2 weeks when you get your lab work done Have nurse to check your blood pressure in this office   Arrie Senate MD

## 2013-08-06 NOTE — Addendum Note (Signed)
Addended by: Pollyann Kennedy F on: 08/06/2013 06:01 PM   Modules accepted: Orders

## 2013-08-06 NOTE — Patient Instructions (Addendum)
Continue current medications. Continue good therapeutic lifestyle changes which include good diet and exercise. Fall precautions discussed with patient. If an FOBT was given today- please return it to our front desk. If you are over 52 years old - you may need Prevnar 35 or the adult Pneumonia vaccine.  Return to clinic in a couple of weeks for fasting lab work including testosterone and PSA Check with her insurance regarding the Prevnar vaccine and a shingle shot Keep drinking plenty of fluids We will call him with the lab work results once those results are available Brain blood pressure readings in 2 weeks when you get your lab work done Have nurse to check your blood pressure in this office

## 2013-08-07 LAB — MICROSCOPIC EXAMINATION
Bacteria, UA: NONE SEEN
EPITHELIAL CELLS (NON RENAL): NONE SEEN /HPF (ref 0–10)

## 2013-08-07 LAB — URINALYSIS, COMPLETE
BILIRUBIN UA: NEGATIVE
GLUCOSE, UA: NEGATIVE
Ketones, UA: NEGATIVE
Leukocytes, UA: NEGATIVE
Nitrite, UA: NEGATIVE
Protein, UA: NEGATIVE
RBC, UA: NEGATIVE
Specific Gravity, UA: 1.016 (ref 1.005–1.030)
Urobilinogen, Ur: 0.2 mg/dL (ref 0.0–1.9)
pH, UA: 6 (ref 5.0–7.5)

## 2013-08-22 ENCOUNTER — Telehealth: Payer: Self-pay | Admitting: Family Medicine

## 2013-08-22 NOTE — Telephone Encounter (Signed)
Patient can wait until this additional procedure is completed and then get his lab work

## 2013-08-22 NOTE — Telephone Encounter (Signed)
Wife Joy- aware

## 2014-02-13 ENCOUNTER — Ambulatory Visit (INDEPENDENT_AMBULATORY_CARE_PROVIDER_SITE_OTHER): Payer: PRIVATE HEALTH INSURANCE | Admitting: Family Medicine

## 2014-02-13 ENCOUNTER — Encounter: Payer: Self-pay | Admitting: Family Medicine

## 2014-02-13 VITALS — BP 140/88 | HR 68 | Temp 98.1°F | Ht 64.0 in | Wt 159.0 lb

## 2014-02-13 DIAGNOSIS — Z23 Encounter for immunization: Secondary | ICD-10-CM

## 2014-02-13 DIAGNOSIS — E785 Hyperlipidemia, unspecified: Secondary | ICD-10-CM

## 2014-02-13 DIAGNOSIS — E8881 Metabolic syndrome: Secondary | ICD-10-CM

## 2014-02-13 DIAGNOSIS — I10 Essential (primary) hypertension: Secondary | ICD-10-CM

## 2014-02-13 DIAGNOSIS — N200 Calculus of kidney: Secondary | ICD-10-CM

## 2014-02-13 DIAGNOSIS — E291 Testicular hypofunction: Secondary | ICD-10-CM

## 2014-02-13 DIAGNOSIS — E349 Endocrine disorder, unspecified: Secondary | ICD-10-CM

## 2014-02-13 DIAGNOSIS — E559 Vitamin D deficiency, unspecified: Secondary | ICD-10-CM

## 2014-02-13 DIAGNOSIS — R21 Rash and other nonspecific skin eruption: Secondary | ICD-10-CM

## 2014-02-13 NOTE — Progress Notes (Signed)
Subjective:    Patient ID: Jorge [name], male    DOB: Dec 24, 1960, 53 y.o.   MRN: [id number]  HPI Pt here for follow up and management of chronic medical problems. The patient comes in today for his routine follow-up. He has a history of testosterone deficiency. More significantly he is been followed for his nephrolithiasis and multiple lithotripsies. According to the patient he still has some small stones in his right kidney that are passable. He will get his flu shot today and will check with his insurance regarding the Prevnar vaccine. He is going to return tomorrow for lab work. He will also be given an FOBT to return. The patient indicates his blood pressure is been good at home and he complains of a rash on his left elbow. Outside blood pressures were brought in for review and the majority of these are well within the normal range.         Patient Active Problem List   Diagnosis Date Noted  . Metabolic syndrome 93/79/0240  . Hyperlipidemia 10/31/2012  . Hypertension 10/31/2012  . Testosterone deficiency 10/31/2012  . Vitamin D deficiency 10/31/2012   Outpatient Encounter Prescriptions as of 02/13/2014  Medication Sig  . cholecalciferol (VITAMIN D) 1000 UNITS tablet Take 2,000 Units by mouth daily.    . fish oil-omega-3 fatty acids 1000 MG capsule Take 2,400 mg by mouth daily.    [name] Kitchen HYDROcodone-acetaminophen (NORCO) 10-325 MG per tablet Take 1 tablet by mouth every 8 (eight) hours as needed.  [name] Kitchen ibuprofen (ADVIL,MOTRIN) 200 MG tablet Take 400 mg by mouth every 6 (six) hours as needed. For pain   . Magnesium 250 MG TABS Take 0.5 tablets by mouth daily.  . nebivolol (BYSTOLIC) 5 MG tablet Take 1 tablet (5 mg total) by mouth daily.  . rosuvastatin (CRESTOR) 40 MG tablet Take 1 tablet (40 mg total) by mouth daily. Take one tab by mouth once a day as directed.  . tamsulosin (FLOMAX) 0.4 MG CAPS capsule Take 1 capsule (0.4 mg total) by mouth daily.  . Testosterone (ANDROGEL PUMP) 20.25  MG/ACT (1.62%) GEL Place 2 application onto the skin daily.    Review of Systems  Constitutional: Negative.   HENT: Negative.   Eyes: Negative.   Respiratory: Negative.   Cardiovascular: Negative.   Gastrointestinal: Negative.   Endocrine: Negative.   Genitourinary: Negative.   Musculoskeletal: Negative.   Skin: Positive for rash (left elbow).  Allergic/Immunologic: Negative.   Neurological: Negative.   Hematological: Negative.   Psychiatric/Behavioral: Negative.        Objective:   Physical Exam  Nursing note and vitals reviewed. Constitutional: He is oriented to person, place, and time. He appears well-developed and well-nourished. No distress.  HENT:  Head: Normocephalic and atraumatic.  Right Ear: External ear normal.  Left Ear: External ear normal.  Mouth/Throat: Oropharynx is clear and moist. No oropharyngeal exudate.  Nasal congestion  Eyes: Conjunctivae and EOM are normal. Pupils are equal, round, and reactive to light. Right eye exhibits no discharge. Left eye exhibits no discharge. No scleral icterus.  Neck: Normal range of motion. Neck supple. No thyromegaly present.  No carotid bruits or cervical adenopathy  Cardiovascular: Normal rate, regular rhythm, normal heart sounds and intact distal pulses.  Exam reveals no gallop and no friction rub.   No murmur heard. At 72/m  Pulmonary/Chest: Effort normal and breath sounds normal. No respiratory distress. He has no wheezes. He has no rales. He exhibits no tenderness.  Lungs are clear  with no axillary adenopathy  Abdominal: Soft. Bowel sounds are normal. He exhibits no mass. There is no tenderness. There is no rebound and no guarding.  No masses or tenderness  Genitourinary:  The prostate exam was not done today as this patient has had one in the past 2 months and we will wait and do it at the next visit.  Musculoskeletal: Normal range of motion. He exhibits no edema and no tenderness.  Lymphadenopathy:    He has no  cervical adenopathy.  Neurological: He is alert and oriented to person, place, and time. He has normal reflexes. No cranial nerve deficit.  Skin: Skin is warm and dry. Rash noted. No erythema. No pallor.  The patient has a lumpy type rash on his left elbow which appears to be warty in nature.-We will do a referral to the dermatologist for biopsy and treatment.  Psychiatric: He has a normal mood and affect. His behavior is normal. Judgment and thought content normal.   BP 140/88  Pulse 68  Temp(Src) 98.1 F (36.7 C) (Oral)  Ht 5\' 4"  (1.626 m)  Wt 159 lb (72.122 kg)  BMI 27.28 kg/m2        Assessment & Plan:  1. Hyperlipidemia  2. Essential hypertension  3. Metabolic syndrome  4. Testosterone deficiency  5. Vitamin D deficiency  6. Nephrolithiasis - Magnesium; Future  7. Rash - Ambulatory referral to Dermatology  Meds ordered this encounter  Medications  . Magnesium 250 MG TABS    Sig: Take 0.5 tablets by mouth daily.   Patient Instructions  Continue current medications. Continue good therapeutic lifestyle changes which include good diet and exercise. Fall precautions discussed with patient. If an FOBT was given today- please return it to our front desk. If you are over 15 years old - you may need Prevnar 72 or the adult Pneumonia vaccine.  Flu Shots will be available at our office starting mid- September. Please call and schedule a FLU CLINIC APPOINTMENT.   Continue to drink plenty of fluids and take current medications bring blood pressure readings by and bring your monitor by and we will compare your reading to a reading with our monitor to make sure that your monitor is correct. Continue to watch sodium intake Try to watch carbohydrates and sweets more closely Try to stay awake in the evening instead of taking evening so that you will rest better at nighttime For now continue the magnesium as your doing and we will check the level with your blood work to see if  we need to increase the dosage anymore. Check with her insurance regarding the Prevnar vaccine Return the FOBT We will do a referral to a dermatologist  for the left elbow Continue follow-up with urology for kidney stones   Arrie Senate MD

## 2014-02-13 NOTE — Patient Instructions (Addendum)
Continue current medications. Continue good therapeutic lifestyle changes which include good diet and exercise. Fall precautions discussed with patient. If an FOBT was given today- please return it to our front desk. If you are over 52 years old - you may need Prevnar 24 or the adult Pneumonia vaccine.  Flu Shots will be available at our office starting mid- September. Please call and schedule a FLU CLINIC APPOINTMENT.   Continue to drink plenty of fluids and take current medications bring blood pressure readings by and bring your monitor by and we will compare your reading to a reading with our monitor to make sure that your monitor is correct. Continue to watch sodium intake Try to watch carbohydrates and sweets more closely Try to stay awake in the evening instead of taking evening so that you will rest better at nighttime For now continue the magnesium as your doing and we will check the level with your blood work to see if we need to increase the dosage anymore. Check with her insurance regarding the Prevnar vaccine Return the FOBT We will do a referral to a dermatologist  for the left elbow Continue follow-up with urology for kidney stones

## 2014-02-14 ENCOUNTER — Other Ambulatory Visit (INDEPENDENT_AMBULATORY_CARE_PROVIDER_SITE_OTHER): Payer: PRIVATE HEALTH INSURANCE

## 2014-02-14 DIAGNOSIS — E785 Hyperlipidemia, unspecified: Secondary | ICD-10-CM

## 2014-02-14 DIAGNOSIS — N200 Calculus of kidney: Secondary | ICD-10-CM

## 2014-02-14 DIAGNOSIS — E559 Vitamin D deficiency, unspecified: Secondary | ICD-10-CM

## 2014-02-14 DIAGNOSIS — E291 Testicular hypofunction: Secondary | ICD-10-CM

## 2014-02-14 DIAGNOSIS — E349 Endocrine disorder, unspecified: Secondary | ICD-10-CM

## 2014-02-14 DIAGNOSIS — I10 Essential (primary) hypertension: Secondary | ICD-10-CM

## 2014-02-14 LAB — POCT CBC
GRANULOCYTE PERCENT: 62.8 % (ref 37–80)
HEMATOCRIT: 46.5 % (ref 43.5–53.7)
Hemoglobin: 15.3 g/dL (ref 14.1–18.1)
Lymph, poc: 1.8 (ref 0.6–3.4)
MCH: 31.1 pg (ref 27–31.2)
MCHC: 32.9 g/dL (ref 31.8–35.4)
MCV: 94.5 fL (ref 80–97)
MPV: 8.3 fL (ref 0–99.8)
POC Granulocyte: 3.8 (ref 2–6.9)
POC LYMPH PERCENT: 30.2 %L (ref 10–50)
Platelet Count, POC: 167 10*3/uL (ref 142–424)
RBC: 4.9 M/uL (ref 4.69–6.13)
RDW, POC: 12.5 %
WBC: 6 10*3/uL (ref 4.6–10.2)

## 2014-02-14 NOTE — Progress Notes (Signed)
Lab only 

## 2014-02-15 LAB — BMP8+EGFR
BUN/Creatinine Ratio: 14 (ref 9–20)
BUN: 14 mg/dL (ref 6–24)
CO2: 24 mmol/L (ref 18–29)
Calcium: 8.9 mg/dL (ref 8.7–10.2)
Chloride: 104 mmol/L (ref 97–108)
Creatinine, Ser: 1.02 mg/dL (ref 0.76–1.27)
GFR calc Af Amer: 98 mL/min/{1.73_m2}
GFR calc non Af Amer: 85 mL/min/{1.73_m2}
Glucose: 99 mg/dL (ref 65–99)
Potassium: 4.1 mmol/L (ref 3.5–5.2)
Sodium: 143 mmol/L (ref 134–144)

## 2014-02-15 LAB — HEPATIC FUNCTION PANEL
ALBUMIN: 4.1 g/dL (ref 3.5–5.5)
ALK PHOS: 59 IU/L (ref 39–117)
ALT: 30 IU/L (ref 0–44)
AST: 20 IU/L (ref 0–40)
Bilirubin, Direct: 0.15 mg/dL (ref 0.00–0.40)
Total Bilirubin: 0.4 mg/dL (ref 0.0–1.2)
Total Protein: 6.4 g/dL (ref 6.0–8.5)

## 2014-02-15 LAB — NMR, LIPOPROFILE
Cholesterol: 89 mg/dL — ABNORMAL LOW (ref 100–199)
HDL Cholesterol by NMR: 33 mg/dL — ABNORMAL LOW (ref >39)
HDL Particle Number: 27.7 umol/L — ABNORMAL LOW (ref >=30.5)
LDL Particle Number: 568 nmol/L (ref <1000)
LDL Size: 20.1 nm (ref >20.5)
LDL-C: 38 mg/dL (ref 0–99)
LP-IR SCORE: 70 — AB (ref <=45)
SMALL LDL PARTICLE NUMBER: 366 nmol/L (ref <=527)
Triglycerides by NMR: 92 mg/dL (ref 0–149)

## 2014-02-15 LAB — MAGNESIUM: Magnesium: 2 mg/dL (ref 1.6–2.6)

## 2014-02-15 LAB — VITAMIN D 25 HYDROXY (VIT D DEFICIENCY, FRACTURES): VIT D 25 HYDROXY: 42.5 ng/mL (ref 30.0–100.0)

## 2014-02-19 ENCOUNTER — Telehealth: Payer: Self-pay | Admitting: Family Medicine

## 2014-02-19 NOTE — Telephone Encounter (Signed)
Please review

## 2014-02-19 NOTE — Telephone Encounter (Signed)
Take 2000 daily Monday through Friday intake 4000 on Saturday and Sunday

## 2014-02-20 NOTE — Telephone Encounter (Signed)
Pt notified and verbalized understanding.

## 2014-03-26 ENCOUNTER — Other Ambulatory Visit: Payer: PRIVATE HEALTH INSURANCE

## 2014-03-26 DIAGNOSIS — Z1212 Encounter for screening for malignant neoplasm of rectum: Secondary | ICD-10-CM

## 2014-03-27 LAB — FECAL OCCULT BLOOD, IMMUNOCHEMICAL: FECAL OCCULT BLD: NEGATIVE

## 2014-07-02 ENCOUNTER — Ambulatory Visit (INDEPENDENT_AMBULATORY_CARE_PROVIDER_SITE_OTHER): Payer: PRIVATE HEALTH INSURANCE | Admitting: Family Medicine

## 2014-07-02 ENCOUNTER — Encounter: Payer: Self-pay | Admitting: Family Medicine

## 2014-07-02 ENCOUNTER — Encounter (INDEPENDENT_AMBULATORY_CARE_PROVIDER_SITE_OTHER): Payer: Self-pay

## 2014-07-02 ENCOUNTER — Ambulatory Visit: Payer: PRIVATE HEALTH INSURANCE | Admitting: *Deleted

## 2014-07-02 VITALS — BP 135/84 | HR 68 | Temp 97.2°F | Ht 64.0 in | Wt 161.0 lb

## 2014-07-02 DIAGNOSIS — IMO0001 Reserved for inherently not codable concepts without codable children: Secondary | ICD-10-CM

## 2014-07-02 DIAGNOSIS — T887XXA Unspecified adverse effect of drug or medicament, initial encounter: Secondary | ICD-10-CM

## 2014-07-02 DIAGNOSIS — R030 Elevated blood-pressure reading, without diagnosis of hypertension: Secondary | ICD-10-CM

## 2014-07-02 DIAGNOSIS — R0989 Other specified symptoms and signs involving the circulatory and respiratory systems: Secondary | ICD-10-CM | POA: Diagnosis not present

## 2014-07-02 DIAGNOSIS — I10 Essential (primary) hypertension: Secondary | ICD-10-CM

## 2014-07-02 DIAGNOSIS — T50905A Adverse effect of unspecified drugs, medicaments and biological substances, initial encounter: Secondary | ICD-10-CM

## 2014-07-02 DIAGNOSIS — E291 Testicular hypofunction: Secondary | ICD-10-CM

## 2014-07-02 DIAGNOSIS — E349 Endocrine disorder, unspecified: Secondary | ICD-10-CM

## 2014-07-02 MED ORDER — TESTOSTERONE 20.25 MG/ACT (1.62%) TD GEL: 2.0000 "application " | Freq: Every day | TRANSDERMAL | Status: DC

## 2014-07-02 NOTE — Progress Notes (Signed)
Pt came in c/o elevated BP, BP reading here was 137/75 and pulse 69. Pt states he feels like his stomach is jittery. Pt given appt this afternoon with Dr.Moore.

## 2014-07-02 NOTE — Patient Instructions (Addendum)
The patient should discontinue the potassium citrate for a couple weeks and see if he feels better. He should continue to drink plenty of water He needs to make a special effort to lose the 45 pounds of weight that he is put on during the winter months He should watch his sodium intake more closely Monitor blood pressures as directed and bring them in for review in a couple weeks My nurse will check your past office visits and find out when you need to have a testosterone level and a prostate exam in order to make sure that everything is good with continuing to use the AndroGel He should be on the AndroGel for at least 2-3 weeks before we recheck the testosterone levels For now, continue current blood pressure medication

## 2014-07-02 NOTE — Progress Notes (Signed)
Subjective:    Patient ID: Jorge [name], male    DOB: 04-Feb-1961, 54 y.o.   MRN: [id number]  HPI  Patient here today for elevated Bp and a "jittery sensation" in his belly for about 2 week.         Patient Active Problem List   Diagnosis Date Noted  . Nephrolithiasis 02/13/2014  . Metabolic syndrome 68/11/8108  . Hyperlipidemia 10/31/2012  . Hypertension 10/31/2012  . Testosterone deficiency 10/31/2012  . Vitamin D deficiency 10/31/2012   Outpatient Encounter Prescriptions as of 07/02/2014  Medication Sig  . cholecalciferol (VITAMIN D) 1000 UNITS tablet Take 2,000 Units by mouth daily.    . fish oil-omega-3 fatty acids 1000 MG capsule Take 2,400 mg by mouth daily.    [name] Kitchen HYDROcodone-acetaminophen (NORCO) 10-325 MG per tablet Take 1 tablet by mouth every 8 (eight) hours as needed.  [name] Kitchen ibuprofen (ADVIL,MOTRIN) 200 MG tablet Take 400 mg by mouth every 6 (six) hours as needed. For pain   . Magnesium 250 MG TABS Take 0.5 tablets by mouth daily.  . nebivolol (BYSTOLIC) 5 MG tablet Take 1 tablet (5 mg total) by mouth daily.  . Potassium 99 MG TABS Take 1 tablet by mouth 2 (two) times daily.  . rosuvastatin (CRESTOR) 40 MG tablet Take 1 tablet (40 mg total) by mouth daily. Take one tab by mouth once a day as directed.  . tamsulosin (FLOMAX) 0.4 MG CAPS capsule Take 1 capsule (0.4 mg total) by mouth daily.  . Testosterone (ANDROGEL PUMP) 20.25 MG/ACT (1.62%) GEL Place 2 application onto the skin daily.    Review of Systems  Constitutional: Negative.   HENT: Negative.   Eyes: Negative.   Respiratory: Negative.   Cardiovascular: Negative.        Elevated BP and jittery  Gastrointestinal: Negative.   Endocrine: Negative.   Genitourinary: Negative.   Musculoskeletal: Negative.   Skin: Negative.   Allergic/Immunologic: Negative.   Neurological: Negative.   Hematological: Negative.   Psychiatric/Behavioral: Negative.        Objective:   Physical Exam  Constitutional: He is  oriented to person, place, and time. He appears well-developed and well-nourished. No distress.  HENT:  Head: Normocephalic and atraumatic.  Eyes: Conjunctivae and EOM are normal. Pupils are equal, round, and reactive to light. Right eye exhibits no discharge. Left eye exhibits no discharge. No scleral icterus.  Neck: Normal range of motion. Neck supple. No thyromegaly present.  No bruits  Cardiovascular: Normal rate, regular rhythm and normal heart sounds.   No murmur heard. Pulmonary/Chest: Effort normal and breath sounds normal. No respiratory distress. He has no wheezes. He has no rales. He exhibits no tenderness.  Abdominal: Soft. Bowel sounds are normal. He exhibits no mass. There is no tenderness. There is no rebound and no guarding.  No epigastric tenderness  Musculoskeletal: Normal range of motion. He exhibits no edema.  Lymphadenopathy:    He has no cervical adenopathy.  Neurological: He is alert and oriented to person, place, and time.  Skin: Skin is warm and dry. No rash noted.  Psychiatric: He has a normal mood and affect. His behavior is normal. Judgment and thought content normal.  Nursing note and vitals reviewed.  BP 135/84 mmHg  Pulse 68  Temp(Src) 97.2 F (36.2 C) (Oral)  Ht '5\' 4"'  (1.626 m)  Wt 161 lb (73.029 kg)  BMI 27.62 kg/m2  Repeat blood pressure by me manually was 142/92 in the right arm sitting  Assessment & Plan:  1. Elevated heart rate and blood pressure -The patient will continue to monitor his blood pressure readings at home and will work on watching his sodium intake and losing weight. He'll also make a special effort to avoid NSAIDs. - POCT CBC - BMP8+EGFR - EKG 12-Lead  2. Testosterone deficiency -We will refill his AndroGel today and we will check and see when he needs his next exam and call him  3. Medication side effect, initial encounter -Hold the potassium citrate for a couple weeks and when he brings his blood pressures in for  review he can let us know how he is feeling at that time.  Patient Instructions  The patient should discontinue the potassium citrate for a couple weeks and see if he feels better. He should continue to drink plenty of water He needs to make a special effort to lose the 45 pounds of weight that he is put on during the winter months He should watch his sodium intake more closely Monitor blood pressures as directed and bring them in for review in a couple weeks My nurse will check your past office visits and find out when you need to have a testosterone level and a prostate exam in order to make sure that everything is good with continuing to use the AndroGel He should be on the AndroGel for at least 2-3 weeks before we recheck the testosterone levels For now, continue current blood pressure medication   Arrie Senate MD

## 2014-07-04 ENCOUNTER — Telehealth: Payer: Self-pay | Admitting: Family Medicine

## 2014-07-04 NOTE — Telephone Encounter (Signed)
Did not need to leave message

## 2014-07-09 ENCOUNTER — Telehealth: Payer: Self-pay | Admitting: Family Medicine

## 2014-07-15 ENCOUNTER — Other Ambulatory Visit: Payer: Self-pay | Admitting: *Deleted

## 2014-07-15 MED ORDER — ROSUVASTATIN CALCIUM 40 MG PO TABS: 40.0000 mg | ORAL_TABLET | Freq: Every day | ORAL | Status: DC

## 2014-07-15 MED ORDER — NEBIVOLOL HCL 5 MG PO TABS: 5.0000 mg | ORAL_TABLET | Freq: Every day | ORAL | Status: DC

## 2014-07-15 MED ORDER — TESTOSTERONE 20.25 MG/ACT (1.62%) TD GEL: 2.0000 "application " | Freq: Every day | TRANSDERMAL | Status: DC

## 2014-07-15 NOTE — Telephone Encounter (Signed)
Patient states that this is what his insurance will cover for mail order. Please print and sign and route to pool A so nurse can call patient

## 2014-07-15 NOTE — Telephone Encounter (Signed)
All of these prescriptions are okay to refill

## 2014-07-16 ENCOUNTER — Other Ambulatory Visit: Payer: Self-pay | Admitting: Nurse Practitioner

## 2014-07-16 MED ORDER — NEBIVOLOL HCL 5 MG PO TABS: 5.0000 mg | ORAL_TABLET | Freq: Every day | ORAL | Status: DC

## 2014-07-16 MED ORDER — TESTOSTERONE 20.25 MG/ACT (1.62%) TD GEL: 2.0000 "application " | Freq: Every day | TRANSDERMAL | Status: DC

## 2014-07-16 MED ORDER — ROSUVASTATIN CALCIUM 40 MG PO TABS: 40.0000 mg | ORAL_TABLET | Freq: Every day | ORAL | Status: DC

## 2014-08-15 ENCOUNTER — Ambulatory Visit: Payer: PRIVATE HEALTH INSURANCE | Admitting: Family Medicine

## 2014-08-18 ENCOUNTER — Ambulatory Visit (INDEPENDENT_AMBULATORY_CARE_PROVIDER_SITE_OTHER): Payer: PRIVATE HEALTH INSURANCE | Admitting: Family Medicine

## 2014-08-18 ENCOUNTER — Telehealth: Payer: Self-pay | Admitting: Family Medicine

## 2014-08-18 ENCOUNTER — Encounter: Payer: Self-pay | Admitting: Family Medicine

## 2014-08-18 VITALS — BP 121/68 | HR 71 | Temp 97.6°F | Ht 64.0 in | Wt 164.0 lb

## 2014-08-18 DIAGNOSIS — E559 Vitamin D deficiency, unspecified: Secondary | ICD-10-CM

## 2014-08-18 DIAGNOSIS — E291 Testicular hypofunction: Secondary | ICD-10-CM | POA: Diagnosis not present

## 2014-08-18 DIAGNOSIS — E785 Hyperlipidemia, unspecified: Secondary | ICD-10-CM

## 2014-08-18 DIAGNOSIS — R030 Elevated blood-pressure reading, without diagnosis of hypertension: Secondary | ICD-10-CM

## 2014-08-18 DIAGNOSIS — R0989 Other specified symptoms and signs involving the circulatory and respiratory systems: Secondary | ICD-10-CM | POA: Diagnosis not present

## 2014-08-18 DIAGNOSIS — E8881 Metabolic syndrome: Secondary | ICD-10-CM

## 2014-08-18 DIAGNOSIS — IMO0001 Reserved for inherently not codable concepts without codable children: Secondary | ICD-10-CM

## 2014-08-18 DIAGNOSIS — I10 Essential (primary) hypertension: Secondary | ICD-10-CM

## 2014-08-18 DIAGNOSIS — E349 Endocrine disorder, unspecified: Secondary | ICD-10-CM

## 2014-08-18 NOTE — Telephone Encounter (Signed)
Patients wife called and states that his insurance does not cover physicals and it has to have a problem attached with the charge

## 2014-08-18 NOTE — Addendum Note (Signed)
Addended by: Zannie Cove on: 08/18/2014 04:24 PM   Modules accepted: Level of Service

## 2014-08-18 NOTE — Telephone Encounter (Signed)
This was fixed in the pt's chart

## 2014-08-18 NOTE — Patient Instructions (Addendum)
Continue current medications. Continue good therapeutic lifestyle changes which include good diet and exercise. Fall precautions discussed with patient. If an FOBT was given today- please return it to our front desk. If you are over 53 years old - you may need Prevnar 51 or the adult Pneumonia vaccine.  Flu Shots are still available at our office. If you still haven't had one please call to set up a nurse visit to get one.   After your visit with [place] today you will receive a survey in the mail or online from [employer] regarding your care with [place]. Please take a moment to fill this out. Your feedback is very important to [place] as you can help [place] better understand your patient needs as well as improve your experience and satisfaction. WE CARE ABOUT YOU!!!   Please check with your insurance regarding the Prevnar vaccine Return to clinic fasting to get lab work done Remember that colonoscopies are done every 10 years and fecal occult blood tests are done yearly

## 2014-08-18 NOTE — Progress Notes (Signed)
Subjective:    Patient ID: Jorge [name], male    DOB: 08/06/1960, 54 y.o.   MRN: [id number]  HPI Patient is here today for annual wellness exam and follow up of chronic medical problems which includes hypertension and hyperlipidemia. He is taking medications regularly. The patient brings in blood pressures from the outside and all of these blood pressures are good. They will be scanned into the record. He is due for lab work and we'll will return fasting for this. He is going to check with his insurance regarding the Prevnar vaccine. He will be due a PSA and prostate exam. The patient's family history was reviewed and it is significant for heart disease and diabetes and Alzheimer's. He has no siblings. He's had problems over the past year with kidney stones and has had multiple visits regarding this. He still has stones present but has been told they were all passable and not to large to not pass.       Patient Active Problem List   Diagnosis Date Noted  . Nephrolithiasis 02/13/2014  . Metabolic syndrome 50/38/8828  . Hyperlipidemia 10/31/2012  . Hypertension 10/31/2012  . Testosterone deficiency 10/31/2012  . Vitamin D deficiency 10/31/2012   Outpatient Encounter Prescriptions as of 08/18/2014  . Order #: [id number] Class: Historical Med  . Order #: [id number] Class: Historical Med  . Order #: [id number] Class: Print  . Order #: [id number] Class: Historical Med  . Order #: [id number] Class: Historical Med  . Order #: [id number] Class: Print  . Order #: [id number] Class: Historical Med  . Order #: [id number] Class: Print  . Order #: [id number] Class: Print  . [DISCONTINUED] Order #: [id number] Class: Phone In    Review of Systems  Constitutional: Negative.   HENT: Negative.   Eyes: Negative.   Respiratory: Negative.   Cardiovascular: Negative.   Gastrointestinal: Negative.   Endocrine: Negative.   Genitourinary: Negative.   Musculoskeletal: Negative.   Skin: Negative.     Allergic/Immunologic: Negative.   Neurological: Negative.   Hematological: Negative.   Psychiatric/Behavioral: Negative.        Objective:   Physical Exam  Constitutional: He is oriented to person, place, and time. He appears well-developed and well-nourished. No distress.  HENT:  Head: Normocephalic and atraumatic.  Right Ear: External ear normal.  Left Ear: External ear normal.  Nose: Nose normal.  Mouth/Throat: Oropharynx is clear and moist. No oropharyngeal exudate.  Eyes: Conjunctivae and EOM are normal. Pupils are equal, round, and reactive to light. Right eye exhibits no discharge. Left eye exhibits no discharge. No scleral icterus.  Neck: Normal range of motion. Neck supple. No thyromegaly present.  Anterior cervical adenopathy or carotid bruits  Cardiovascular: Normal rate, regular rhythm, normal heart sounds and intact distal pulses.  Exam reveals no gallop and no friction rub.   No murmur heard. 60/m  Pulmonary/Chest: Effort normal and breath sounds normal. No respiratory distress. He has no wheezes. He has no rales. He exhibits no tenderness.  Clear anteriorly and posteriorly  Abdominal: Soft. Bowel sounds are normal. He exhibits no mass. There is no tenderness. There is no rebound and no guarding.  No tenderness or organ enlargement  Genitourinary: Rectum normal and penis normal.  The prostate was slightly enlarged but smooth without lumps or masses. The rectal exam was negative for masses. There were no inguinal hernias inguinal adenopathy. There were no testicular lumps or masses. The external genitalia were normal.  Musculoskeletal: Normal range of motion. He  exhibits no edema or tenderness.  Lymphadenopathy:    He has no cervical adenopathy.  Neurological: He is alert and oriented to person, place, and time. He has normal reflexes. No cranial nerve deficit.  Skin: Skin is warm and dry. No rash noted. No erythema. No pallor.  Psychiatric: He has a normal mood and  affect. His behavior is normal. Judgment and thought content normal.  Nursing note and vitals reviewed.  BP 121/68 mmHg  Pulse 71  Temp(Src) 97.6 F (36.4 C) (Oral)  Ht _0  (1.626 m)  Wt 164 lb (74.39 kg)  BMI 28.14 kg/m2        Assessment & Plan:  1. Elevated heart rate and blood pressure -Blood pressure and heart rate were good today. - POCT CBC; Future - BMP8+EGFR; Future - Hepatic function panel; Future  2. Testosterone deficiency -The patient is doing well with with his testosterone deficiency and has no complaints regarding this. - POCT CBC; Future - POCT UA - Microscopic Only; Future - POCT urinalysis dipstick; Future - PSA, total and free; Future - Testosterone,Free and Total; Future  3. Essential hypertension -His blood pressure is good today. - POCT CBC; Future - BMP8+EGFR; Future - Hepatic function panel; Future  4. Hyperlipidemia -He will continue his current cholesterol treatment pending results of lab work that is yet to be done - POCT CBC; Future - NMR, lipoprofile; Future  5. Vitamin D deficiency -Continue vitamin D replacement contingent upon lab work - POCT CBC; Future - Vit D  25 hydroxy (rtn osteoporosis monitoring); Future  6. Metabolic syndrome -Continue with aggressive therapeutic lifestyle changes drinking plenty of water and less drinks and watching his diet and exercising more - POCT CBC; Future - BMP8+EGFR; Future  7. Annual physical exam -We will arrange to do an exercise treadmill test this summer - POCT CBC; Future - BMP8+EGFR; Future - Hepatic function panel; Future - NMR, lipoprofile; Future - Vit D  25 hydroxy (rtn osteoporosis monitoring); Future - POCT UA - Microscopic Only; Future - POCT urinalysis dipstick; Future - PSA, total and free; Future - Testosterone,Free and Total; Future  Patient Instructions  Continue current medications. Continue good therapeutic lifestyle changes which include good diet and  exercise. Fall precautions discussed with patient. If an FOBT was given today- please return it to our front desk. If you are over 40 years old - you may need Prevnar 52 or the adult Pneumonia vaccine.  Flu Shots are still available at our office. If you still haven't had one please call to set up a nurse visit to get one.   After your visit with [place] today you will receive a survey in the mail or online from [employer] regarding your care with [place]. Please take a moment to fill this out. Your feedback is very important to [place] as you can help [place] better understand your patient needs as well as improve your experience and satisfaction. WE CARE ABOUT YOU!!!   Please check with your insurance regarding the Prevnar vaccine Return to clinic fasting to get lab work done Remember that colonoscopies are done every 10 years and fecal occult blood tests are done yearly   Arrie Senate MD

## 2014-08-19 ENCOUNTER — Other Ambulatory Visit (INDEPENDENT_AMBULATORY_CARE_PROVIDER_SITE_OTHER): Payer: PRIVATE HEALTH INSURANCE

## 2014-08-19 DIAGNOSIS — R0989 Other specified symptoms and signs involving the circulatory and respiratory systems: Secondary | ICD-10-CM | POA: Diagnosis not present

## 2014-08-19 DIAGNOSIS — E349 Endocrine disorder, unspecified: Secondary | ICD-10-CM

## 2014-08-19 DIAGNOSIS — IMO0001 Reserved for inherently not codable concepts without codable children: Secondary | ICD-10-CM

## 2014-08-19 DIAGNOSIS — E291 Testicular hypofunction: Secondary | ICD-10-CM | POA: Diagnosis not present

## 2014-08-19 DIAGNOSIS — E785 Hyperlipidemia, unspecified: Secondary | ICD-10-CM | POA: Diagnosis not present

## 2014-08-19 DIAGNOSIS — R030 Elevated blood-pressure reading, without diagnosis of hypertension: Secondary | ICD-10-CM | POA: Diagnosis not present

## 2014-08-19 DIAGNOSIS — E559 Vitamin D deficiency, unspecified: Secondary | ICD-10-CM | POA: Diagnosis not present

## 2014-08-19 DIAGNOSIS — E8881 Metabolic syndrome: Secondary | ICD-10-CM

## 2014-08-19 DIAGNOSIS — I10 Essential (primary) hypertension: Secondary | ICD-10-CM

## 2014-08-19 LAB — POCT UA - MICROSCOPIC ONLY
Casts, Ur, LPF, POC: NEGATIVE
Crystals, Ur, HPF, POC: NEGATIVE
EPITHELIAL CELLS, URINE PER MICROSCOPY: NEGATIVE
RBC, URINE, MICROSCOPIC: NEGATIVE
YEAST UA: NEGATIVE

## 2014-08-19 LAB — POCT CBC
GRANULOCYTE PERCENT: 66.2 % (ref 37–80)
HEMATOCRIT: 49.7 % (ref 43.5–53.7)
Hemoglobin: 15.9 g/dL (ref 14.1–18.1)
Lymph, poc: 2.3 (ref 0.6–3.4)
MCH, POC: 30.3 pg (ref 27–31.2)
MCHC: 31.9 g/dL (ref 31.8–35.4)
MCV: 94.8 fL (ref 80–97)
MPV: 8.2 fL (ref 0–99.8)
POC GRANULOCYTE: 4.7 (ref 2–6.9)
POC LYMPH PERCENT: 32.1 %L (ref 10–50)
Platelet Count, POC: 189 10*3/uL (ref 142–424)
RBC: 5.24 M/uL (ref 4.69–6.13)
RDW, POC: 12.7 %
WBC: 7.1 10*3/uL (ref 4.6–10.2)

## 2014-08-19 LAB — POCT URINALYSIS DIPSTICK
BILIRUBIN UA: NEGATIVE
Blood, UA: NEGATIVE
GLUCOSE UA: NEGATIVE
Ketones, UA: NEGATIVE
LEUKOCYTES UA: NEGATIVE
Nitrite, UA: NEGATIVE
PH UA: 6
PROTEIN UA: NEGATIVE
SPEC GRAV UA: 1.02
Urobilinogen, UA: NEGATIVE

## 2014-08-19 NOTE — Progress Notes (Signed)
Lab only 

## 2014-08-20 LAB — PSA, TOTAL AND FREE
PSA, Free Pct: 52.5 %
PSA, Free: 0.63 ng/mL
Prostate Specific Ag, Serum: 1.2 ng/mL (ref 0.0–4.0)

## 2014-08-20 LAB — BMP8+EGFR
BUN/Creatinine Ratio: 12 (ref 9–20)
BUN: 12 mg/dL (ref 6–24)
CHLORIDE: 100 mmol/L (ref 97–108)
CO2: 26 mmol/L (ref 18–29)
Calcium: 9.5 mg/dL (ref 8.7–10.2)
Creatinine, Ser: 1 mg/dL (ref 0.76–1.27)
GFR calc Af Amer: 100 mL/min/{1.73_m2} (ref >59)
GFR calc non Af Amer: 86 mL/min/{1.73_m2} (ref >59)
Glucose: 109 mg/dL — ABNORMAL HIGH (ref 65–99)
Potassium: 4.7 mmol/L (ref 3.5–5.2)
Sodium: 140 mmol/L (ref 134–144)

## 2014-08-20 LAB — NMR, LIPOPROFILE
CHOLESTEROL: 99 mg/dL — AB (ref 100–199)
HDL Cholesterol by NMR: 37 mg/dL — ABNORMAL LOW (ref >39)
HDL Particle Number: 31.7 umol/L (ref >=30.5)
LDL PARTICLE NUMBER: 536 nmol/L (ref <1000)
LDL SIZE: 20 nm (ref >20.5)
LDL-C: 37 mg/dL (ref 0–99)
LP-IR SCORE: 74 — AB (ref <=45)
Small LDL Particle Number: 356 nmol/L (ref <=527)
Triglycerides by NMR: 123 mg/dL (ref 0–149)

## 2014-08-20 LAB — HEPATIC FUNCTION PANEL
ALBUMIN: 4.5 g/dL (ref 3.5–5.5)
ALT: 44 IU/L (ref 0–44)
AST: 26 IU/L (ref 0–40)
Alkaline Phosphatase: 60 IU/L (ref 39–117)
Bilirubin Total: 0.5 mg/dL (ref 0.0–1.2)
Bilirubin, Direct: 0.16 mg/dL (ref 0.00–0.40)
TOTAL PROTEIN: 6.8 g/dL (ref 6.0–8.5)

## 2014-08-20 LAB — TESTOSTERONE,FREE AND TOTAL
Testosterone, Free: 10.6 pg/mL (ref 7.2–24.0)
Testosterone: 158 ng/dL — ABNORMAL LOW (ref 348–1197)

## 2014-08-20 LAB — VITAMIN D 25 HYDROXY (VIT D DEFICIENCY, FRACTURES): Vit D, 25-Hydroxy: 39.3 ng/mL (ref 30.0–100.0)

## 2015-02-23 ENCOUNTER — Ambulatory Visit (INDEPENDENT_AMBULATORY_CARE_PROVIDER_SITE_OTHER): Payer: PRIVATE HEALTH INSURANCE | Admitting: Family Medicine

## 2015-02-23 ENCOUNTER — Encounter: Payer: Self-pay | Admitting: Family Medicine

## 2015-02-23 VITALS — BP 144/85 | HR 67 | Temp 98.2°F | Ht 64.0 in | Wt 159.0 lb

## 2015-02-23 DIAGNOSIS — E785 Hyperlipidemia, unspecified: Secondary | ICD-10-CM

## 2015-02-23 DIAGNOSIS — E349 Endocrine disorder, unspecified: Secondary | ICD-10-CM

## 2015-02-23 DIAGNOSIS — E559 Vitamin D deficiency, unspecified: Secondary | ICD-10-CM

## 2015-02-23 DIAGNOSIS — E291 Testicular hypofunction: Secondary | ICD-10-CM | POA: Diagnosis not present

## 2015-02-23 DIAGNOSIS — I10 Essential (primary) hypertension: Secondary | ICD-10-CM | POA: Diagnosis not present

## 2015-02-23 DIAGNOSIS — E8881 Metabolic syndrome: Secondary | ICD-10-CM | POA: Diagnosis not present

## 2015-02-23 DIAGNOSIS — Z23 Encounter for immunization: Secondary | ICD-10-CM

## 2015-02-23 LAB — POCT UA - MICROSCOPIC ONLY
Bacteria, U Microscopic: NEGATIVE
CRYSTALS, UR, HPF, POC: NEGATIVE
Casts, Ur, LPF, POC: NEGATIVE
Epithelial cells, urine per micros: NEGATIVE
Mucus, UA: NEGATIVE
RBC, URINE, MICROSCOPIC: NEGATIVE
WBC, UR, HPF, POC: NEGATIVE
Yeast, UA: NEGATIVE

## 2015-02-23 LAB — POCT URINALYSIS DIPSTICK
Bilirubin, UA: NEGATIVE
Blood, UA: NEGATIVE
Glucose, UA: NEGATIVE
Ketones, UA: NEGATIVE
Leukocytes, UA: NEGATIVE
Nitrite, UA: NEGATIVE
PH UA: 6
PROTEIN UA: NEGATIVE
SPEC GRAV UA: 1.02
UROBILINOGEN UA: NEGATIVE

## 2015-02-23 MED ORDER — ROSUVASTATIN CALCIUM 20 MG PO TABS: 20.0000 mg | ORAL_TABLET | Freq: Every day | ORAL | Status: DC

## 2015-02-23 MED ORDER — NEBIVOLOL HCL 5 MG PO TABS: 5.0000 mg | ORAL_TABLET | Freq: Every day | ORAL | Status: DC

## 2015-02-23 MED ORDER — ROSUVASTATIN CALCIUM 40 MG PO TABS: 40.0000 mg | ORAL_TABLET | Freq: Every day | ORAL | Status: DC

## 2015-02-23 MED ORDER — TESTOSTERONE 20.25 MG/ACT (1.62%) TD GEL: 2.0000 "application " | Freq: Every day | TRANSDERMAL | Status: DC

## 2015-02-23 NOTE — Progress Notes (Signed)
Subjective:    Patient ID: Jorge [name], male    DOB: 09/02/1960, 54 y.o.   MRN: [id number]  HPI  Pt here for follow up and management of chronic medical problems which includes testosterone def, hypertension and hyperlipidemia. The patient has no specific complaints today. He has not been on AndroGel because he ran out of his prescription. He has seen the urologist about 6 months ago and has a return visit scheduled this month because of his nephrolithiasis. He has not had any further trouble with this. He will return this weekend for his lab work. We will refill his Crestor last Ollie can AndroGel but told her not to have AndroGel filled until the lab work is back. He says that his blood pressures at home run in the 120 to 1:30 range over the 70s consistently. When reviewing his health maintenance it was noted that he had his last colonoscopy sometime in 2011. His father had colon cancer. He sees [facility]. We will call them and confirmed when he eats to get his next colonoscopy but it should be sometime this year. The patient will get his flu shot today. He will use nasal saline for head congestion. The patient denies chest pain shortness of breath trouble swallowing and heartburn indigestion nausea vomiting diarrhea or blood in the stool. He has not had any more problems with his kidney stones. Since he has run out of his AndroGel he has not had any increased fatigue. He does note that maybe his sexual function has declined somewhat. It is for this reason that we will wait until we get the lab work back before restart starting his AndroGel.       Patient Active Problem List   Diagnosis Date Noted  . Nephrolithiasis 02/13/2014  . Metabolic syndrome 04/88/8916  . Hyperlipidemia 10/31/2012  . Hypertension 10/31/2012  . Testosterone deficiency 10/31/2012  . Vitamin D deficiency 10/31/2012   Outpatient Encounter Prescriptions as of 02/23/2015  Medication Sig  . cholecalciferol (VITAMIN D)  1000 UNITS tablet Take 2,000 Units by mouth daily.    . fish oil-omega-3 fatty acids 1000 MG capsule Take 2,400 mg by mouth daily.    . Magnesium 250 MG TABS Take 0.5 tablets by mouth daily.  . nebivolol (BYSTOLIC) 5 MG tablet Take 1 tablet (5 mg total) by mouth daily.  . rosuvastatin (CRESTOR) 40 MG tablet Take 1 tablet (40 mg total) by mouth daily. Take one tab by mouth once a day as directed.  . Testosterone (ANDROGEL PUMP) 20.25 MG/ACT (1.62%) GEL Place 2 application onto the skin daily.  . [DISCONTINUED] Potassium 99 MG TABS Take 1 tablet by mouth 2 (two) times daily.  [name] Kitchen HYDROcodone-acetaminophen (NORCO) 10-325 MG per tablet Take 1 tablet by mouth every 8 (eight) hours as needed. (Patient not taking: Reported on 02/23/2015)  . ibuprofen (ADVIL,MOTRIN) 200 MG tablet Take 400 mg by mouth every 6 (six) hours as needed. For pain    No facility-administered encounter medications on file as of 02/23/2015.     Review of Systems  Constitutional: Negative.   HENT: Negative.   Eyes: Negative.   Respiratory: Negative.   Cardiovascular: Negative.   Gastrointestinal: Negative.   Endocrine: Negative.   Genitourinary: Negative.   Musculoskeletal: Negative.   Skin: Negative.   Allergic/Immunologic: Negative.   Neurological: Negative.   Hematological: Negative.   Psychiatric/Behavioral: Negative.        Objective:   Physical Exam  Constitutional: He is oriented to person, place, and  time. He appears well-developed and well-nourished. No distress.  HENT:  Head: Normocephalic and atraumatic.  Right Ear: External ear normal.  Left Ear: External ear normal.  Mouth/Throat: Oropharynx is clear and moist. No oropharyngeal exudate.  There is nasal turbinate congestion bilaterally  Eyes: Conjunctivae and EOM are normal. Pupils are equal, round, and reactive to light. Right eye exhibits no discharge. Left eye exhibits no discharge. No scleral icterus.  Neck: Normal range of motion. Neck supple. No  thyromegaly present.  Cardiovascular: Normal rate, regular rhythm, normal heart sounds and intact distal pulses.   No murmur heard. Pulmonary/Chest: Effort normal and breath sounds normal. No respiratory distress. He has no wheezes. He has no rales. He exhibits no tenderness.  Clear anteriorly and posteriorly  Abdominal: Soft. Bowel sounds are normal. He exhibits no mass. There is no tenderness. There is no rebound and no guarding.  No abdominal tenderness masses or inguinal adenopathy  Genitourinary: Rectum normal.  The prostate was enlarged but soft and smooth  Musculoskeletal: Normal range of motion. He exhibits no edema.  Lymphadenopathy:    He has no cervical adenopathy.  Neurological: He is alert and oriented to person, place, and time. He has normal reflexes. No cranial nerve deficit.  Skin: Skin is warm and dry. No rash noted.  Psychiatric: He has a normal mood and affect. His behavior is normal. Judgment and thought content normal.  Nursing note and vitals reviewed.  BP 144/85 mmHg  Pulse 67  Temp(Src) 98.2 F (36.8 C) (Oral)  Ht '5\' 4"'  (1.626 m)  Wt 159 lb (72.122 kg)  BMI 27.28 kg/m2        Assessment & Plan:  1. Hyperlipidemia -Continue current treatment pending results of lab work - CBC with Differential/Platelet; Future - NMR, lipoprofile; Future  2. Essential hypertension -Home blood pressures have been good and the patient will continue with current treatment. They've been running between 120s and 130s over the 70s. - BMP8+EGFR; Future - Hepatic function panel; Future - CBC with Differential/Platelet; Future  3. Testosterone deficiency -The patient has currently been off of AndroGel treatment for about 1 month. We will get his testosterone level and PSA checked. When that level is back we will make a decision about ongoing treatment with AndroGel. His only symptoms have been decreased sexual performance. - CBC with Differential/Platelet; Future -  Testosterone,Free and Total; Future - PSA; Future - POCT UA - Microscopic Only - POCT urinalysis dipstick  4. Vitamin D deficiency -Continue current treatment pending results of lab work - CBC with Differential/Platelet; Future - Vit D  25 hydroxy (rtn osteoporosis monitoring); Future  5. Metabolic syndrome -Continue to work aggressively with diet and exercise on his weight - BMP8+EGFR; Future - CBC with Differential/Platelet; Future - POCT UA - Microscopic Only - POCT urinalysis dipstick  Meds ordered this encounter  Medications  . Testosterone (ANDROGEL PUMP) 20.25 MG/ACT (1.62%) GEL    Sig: Place 2 application onto the skin daily.    Dispense:  150 g    Refill:  3  . DISCONTD: rosuvastatin (CRESTOR) 40 MG tablet    Sig: Take 1 tablet (40 mg total) by mouth daily. Take one tab by mouth once a day as directed.    Dispense:  62 tablet    Refill:  5  . nebivolol (BYSTOLIC) 5 MG tablet    Sig: Take 1 tablet (5 mg total) by mouth daily.    Dispense:  62 tablet    Refill:  5  .  rosuvastatin (CRESTOR) 20 MG tablet    Sig: Take 1 tablet (20 mg total) by mouth daily. Take one tab by mouth once a day as directed.    Dispense:  90 tablet    Refill:  3    Name brand only   Patient Instructions  Continue current medications. Continue good therapeutic lifestyle changes which include good diet and exercise. Fall precautions discussed with patient. If an FOBT was given today- please return it to our front desk. If you are over 61 years old - you may need Prevnar 81 or the adult Pneumonia vaccine.  **Flu shots are available--- please call and schedule a FLU-CLINIC appointment**  After your visit with [place] today you will receive a survey in the mail or online from [employer] regarding your care with [place]. Please take a moment to fill this out. Your feedback is very important to [place] as you can help [place] better understand your patient needs as well as improve your experience and satisfaction.  WE CARE ABOUT YOU!!!   Return to clinic on Saturday for repeat lab work including testosterone level and PSA We will discuss restarting AndroGel after the lab work is returned Follow-up with urology as planned Drink plenty of fluids this winter and stay well hydrated Use nasal saline for head congestion We will check into your need for colonoscopy and when this should be done    Arrie Senate MD

## 2015-02-23 NOTE — Patient Instructions (Addendum)
Continue current medications. Continue good therapeutic lifestyle changes which include good diet and exercise. Fall precautions discussed with patient. If an FOBT was given today- please return it to our front desk. If you are over 53 years old - you may need Prevnar 28 or the adult Pneumonia vaccine.  **Flu shots are available--- please call and schedule a FLU-CLINIC appointment**  After your visit with [place] today you will receive a survey in the mail or online from [employer] regarding your care with [place]. Please take a moment to fill this out. Your feedback is very important to [place] as you can help [place] better understand your patient needs as well as improve your experience and satisfaction. WE CARE ABOUT YOU!!!   Return to clinic on Saturday for repeat lab work including testosterone level and PSA We will discuss restarting AndroGel after the lab work is returned Follow-up with urology as planned Drink plenty of fluids this winter and stay well hydrated Use nasal saline for head congestion We will check into your need for colonoscopy and when this should be done

## 2015-02-24 DIAGNOSIS — Z23 Encounter for immunization: Secondary | ICD-10-CM

## 2015-02-28 ENCOUNTER — Other Ambulatory Visit: Payer: PRIVATE HEALTH INSURANCE

## 2015-02-28 DIAGNOSIS — I10 Essential (primary) hypertension: Secondary | ICD-10-CM

## 2015-02-28 DIAGNOSIS — E349 Endocrine disorder, unspecified: Secondary | ICD-10-CM

## 2015-02-28 DIAGNOSIS — E8881 Metabolic syndrome: Secondary | ICD-10-CM

## 2015-02-28 DIAGNOSIS — E559 Vitamin D deficiency, unspecified: Secondary | ICD-10-CM

## 2015-02-28 DIAGNOSIS — E785 Hyperlipidemia, unspecified: Secondary | ICD-10-CM

## 2015-03-02 LAB — CBC WITH DIFFERENTIAL/PLATELET
BASOS ABS: 0 10*3/uL (ref 0.0–0.2)
Basos: 0 %
EOS (ABSOLUTE): 0.2 10*3/uL (ref 0.0–0.4)
Eos: 4 %
Hematocrit: 48.3 % (ref 37.5–51.0)
Hemoglobin: 16.3 g/dL (ref 12.6–17.7)
IMMATURE GRANULOCYTES: 0 %
Immature Grans (Abs): 0 10*3/uL (ref 0.0–0.1)
LYMPHS ABS: 2 10*3/uL (ref 0.7–3.1)
Lymphs: 30 %
MCH: 32.1 pg (ref 26.6–33.0)
MCHC: 33.7 g/dL (ref 31.5–35.7)
MCV: 95 fL (ref 79–97)
MONOS ABS: 0.6 10*3/uL (ref 0.1–0.9)
Monocytes: 9 %
NEUTROS PCT: 57 %
Neutrophils Absolute: 3.8 10*3/uL (ref 1.4–7.0)
PLATELETS: 199 10*3/uL (ref 150–379)
RBC: 5.08 x10E6/uL (ref 4.14–5.80)
RDW: 13 % (ref 12.3–15.4)
WBC: 6.7 10*3/uL (ref 3.4–10.8)

## 2015-03-02 LAB — TESTOSTERONE,FREE AND TOTAL
TESTOSTERONE FREE: 9.1 pg/mL (ref 7.2–24.0)
Testosterone: 153 ng/dL — ABNORMAL LOW (ref 348–1197)

## 2015-03-02 LAB — HEPATIC FUNCTION PANEL
ALK PHOS: 64 IU/L (ref 39–117)
ALT: 28 IU/L (ref 0–44)
AST: 22 IU/L (ref 0–40)
Albumin: 4.3 g/dL (ref 3.5–5.5)
BILIRUBIN, DIRECT: 0.14 mg/dL (ref 0.00–0.40)
Bilirubin Total: 0.4 mg/dL (ref 0.0–1.2)
TOTAL PROTEIN: 6.6 g/dL (ref 6.0–8.5)

## 2015-03-02 LAB — BMP8+EGFR
BUN/Creatinine Ratio: 13 (ref 9–20)
BUN: 13 mg/dL (ref 6–24)
CALCIUM: 9.3 mg/dL (ref 8.7–10.2)
CO2: 24 mmol/L (ref 18–29)
Chloride: 103 mmol/L (ref 97–106)
Creatinine, Ser: 0.97 mg/dL (ref 0.76–1.27)
GFR, EST AFRICAN AMERICAN: 103 mL/min/{1.73_m2} (ref >59)
GFR, EST NON AFRICAN AMERICAN: 89 mL/min/{1.73_m2} (ref >59)
Glucose: 107 mg/dL — ABNORMAL HIGH (ref 65–99)
POTASSIUM: 4.9 mmol/L (ref 3.5–5.2)
SODIUM: 142 mmol/L (ref 136–144)

## 2015-03-02 LAB — NMR, LIPOPROFILE
CHOLESTEROL: 109 mg/dL (ref 100–199)
HDL CHOLESTEROL BY NMR: 37 mg/dL — AB (ref >39)
HDL PARTICLE NUMBER: 28.7 umol/L — AB (ref >=30.5)
LDL Particle Number: 762 nmol/L (ref <1000)
LDL Size: 19.6 nm (ref >20.5)
LDL-C: 46 mg/dL (ref 0–99)
LP-IR Score: 55 — ABNORMAL HIGH (ref <=45)
SMALL LDL PARTICLE NUMBER: 634 nmol/L — AB (ref <=527)
TRIGLYCERIDES BY NMR: 129 mg/dL (ref 0–149)

## 2015-03-02 LAB — PSA: Prostate Specific Ag, Serum: 1.6 ng/mL (ref 0.0–4.0)

## 2015-03-02 LAB — VITAMIN D 25 HYDROXY (VIT D DEFICIENCY, FRACTURES): Vit D, 25-Hydroxy: 43 ng/mL (ref 30.0–100.0)

## 2015-04-29 ENCOUNTER — Telehealth: Payer: Self-pay | Admitting: Family Medicine

## 2015-04-29 MED ORDER — ROSUVASTATIN CALCIUM 20 MG PO TABS: 20.0000 mg | ORAL_TABLET | Freq: Every day | ORAL | Status: DC

## 2015-04-29 MED ORDER — NEBIVOLOL HCL 5 MG PO TABS: 5.0000 mg | ORAL_TABLET | Freq: Every day | ORAL | Status: DC

## 2015-04-29 MED ORDER — TESTOSTERONE 20.25 MG/ACT (1.62%) TD GEL: 2.0000 "application " | Freq: Every day | TRANSDERMAL | Status: DC

## 2015-04-29 NOTE — Telephone Encounter (Signed)
The testosterone must be local or he will have to mail in

## 2015-04-29 NOTE — Telephone Encounter (Signed)
confirmed 2 month supply is correct per pt wife. She is aware to pick up Testosterone RX and others to be sent

## 2015-05-06 ENCOUNTER — Telehealth: Payer: Self-pay | Admitting: Family Medicine

## 2015-05-06 MED ORDER — ROSUVASTATIN CALCIUM 20 MG PO TABS: 20.0000 mg | ORAL_TABLET | Freq: Every day | ORAL | Status: DC

## 2015-05-06 NOTE — Telephone Encounter (Signed)
Generic resent

## 2015-05-06 NOTE — Telephone Encounter (Signed)
Has name brand written on rx?

## 2015-08-24 ENCOUNTER — Ambulatory Visit: Payer: PRIVATE HEALTH INSURANCE | Admitting: Family Medicine

## 2015-09-18 ENCOUNTER — Ambulatory Visit (INDEPENDENT_AMBULATORY_CARE_PROVIDER_SITE_OTHER): Payer: PRIVATE HEALTH INSURANCE

## 2015-09-18 ENCOUNTER — Ambulatory Visit (INDEPENDENT_AMBULATORY_CARE_PROVIDER_SITE_OTHER): Payer: PRIVATE HEALTH INSURANCE | Admitting: Family Medicine

## 2015-09-18 ENCOUNTER — Encounter: Payer: Self-pay | Admitting: Family Medicine

## 2015-09-18 VITALS — BP 123/70 | HR 63 | Temp 97.6°F | Ht 64.0 in | Wt 157.0 lb

## 2015-09-18 DIAGNOSIS — Z0000 Encounter for general adult medical examination without abnormal findings: Secondary | ICD-10-CM

## 2015-09-18 DIAGNOSIS — I10 Essential (primary) hypertension: Secondary | ICD-10-CM | POA: Diagnosis not present

## 2015-09-18 DIAGNOSIS — Z800 Family history of malignant neoplasm of digestive organs: Secondary | ICD-10-CM

## 2015-09-18 DIAGNOSIS — E559 Vitamin D deficiency, unspecified: Secondary | ICD-10-CM

## 2015-09-18 DIAGNOSIS — E785 Hyperlipidemia, unspecified: Secondary | ICD-10-CM | POA: Diagnosis not present

## 2015-09-18 DIAGNOSIS — E349 Endocrine disorder, unspecified: Secondary | ICD-10-CM

## 2015-09-18 DIAGNOSIS — Z1211 Encounter for screening for malignant neoplasm of colon: Secondary | ICD-10-CM

## 2015-09-18 NOTE — Patient Instructions (Addendum)
Continue current medications. Continue good therapeutic lifestyle changes which include good diet and exercise. Fall precautions discussed with patient. If an FOBT was given today- please return it to our front desk. If you are over 54 years old - you may need Prevnar 45 or the adult Pneumonia vaccine.   After your visit with [place] today you will receive a survey in the mail or online from [employer] regarding your care with [place]. Please take a moment to fill this out. Your feedback is very important to [place] as you can help [place] better understand your patient needs as well as improve your experience and satisfaction. WE CARE ABOUT YOU!!!   We will arrange for you to have a colonoscopy because of your family history of colon cancer and the fact that her last colonoscopy was in 2011. Always keep her stools checked for blood loss Stay active and drink plenty of fluids Eat healthy and keep weight down to a minimum. Your body mass index is 27 and ideally it would be lower at 25

## 2015-09-18 NOTE — Progress Notes (Addendum)
Subjective:    Patient ID: Jorge [name], male    DOB: 08-12-1960, 54 y.o.   MRN: [id number]  HPI Patient is here today for follow up of chronic medical problems which includes hypertension and hyperlipidemia. He is taking medications regularly.Jorge [name] is doing well overall. He is due to get a PSA and rectal exam today because of taking testosterone replacement with AndroGel. His also due to return an FOBT. He'll get a chest x-ray today and lab work today and a urinalysis today. He is going to check with his insurance regarding the Prevnar vaccine. The patient was told by the urologist that he had no more kidney stones or kidney stone fragments. He keeps the pain medicine on hand only if needed and does not take this regularly. At the last visit his testosterone levels were low when he restarted his AndroGel. He cannot tell any difference with using it or not using it. We will check the levels today make sure that they are improved and at least within normal limits. He denies any chest pain shortness of breath trouble swallowing heartburn indigestion nausea vomiting diarrhea blood in the stool or black tarry bowel movements. Stools have not changed any in appearance. He's passing his water without problems and he says that his sex life is stable and not any better with the AndroGel that was without it. Reviewing his family history his mother died from complications of heart valve surgery and his father died shortly after with a history of diabetes. He has no siblings.     Patient Active Problem List   Diagnosis Date Noted  . Nephrolithiasis 02/13/2014  . Metabolic syndrome 94/85/4627  . Hyperlipidemia 10/31/2012  . Hypertension 10/31/2012  . Testosterone deficiency 10/31/2012  . Vitamin D deficiency 10/31/2012   Outpatient Encounter Prescriptions as of 09/18/2015  Medication Sig  . cholecalciferol (VITAMIN D) 1000 UNITS tablet Take 2,000 Units by mouth daily.    . fish oil-omega-3 fatty acids  1000 MG capsule Take 2,400 mg by mouth daily.    [name] Kitchen HYDROcodone-acetaminophen (NORCO) 10-325 MG per tablet Take 1 tablet by mouth every 8 (eight) hours as needed.  [name] Kitchen ibuprofen (ADVIL,MOTRIN) 200 MG tablet Take 400 mg by mouth every 6 (six) hours as needed. For pain   . Magnesium 250 MG TABS Take 0.5 tablets by mouth daily.  . nebivolol (BYSTOLIC) 5 MG tablet Take 1 tablet (5 mg total) by mouth daily.  . rosuvastatin (CRESTOR) 20 MG tablet Take 1 tablet (20 mg total) by mouth daily. Take one tab by mouth once a day as directed.  . Testosterone (ANDROGEL PUMP) 20.25 MG/ACT (1.62%) GEL Place 2 application onto the skin daily.   No facility-administered encounter medications on file as of 09/18/2015.     Review of Systems  Constitutional: Negative.   HENT: Negative.   Eyes: Negative.   Respiratory: Negative.   Cardiovascular: Negative.   Gastrointestinal: Negative.   Endocrine: Negative.   Genitourinary: Negative.   Musculoskeletal: Negative.   Skin: Negative.   Allergic/Immunologic: Negative.   Neurological: Negative.   Hematological: Negative.   Psychiatric/Behavioral: Negative.        Objective:   Physical Exam  Constitutional: He is oriented to person, place, and time. He appears well-developed and well-nourished. No distress.  HENT:  Head: Normocephalic and atraumatic.  Right Ear: External ear normal.  Left Ear: External ear normal.  Nose: Nose normal.  Mouth/Throat: Oropharynx is clear and moist. No oropharyngeal exudate.  Eyes: Conjunctivae and EOM are  normal. Pupils are equal, round, and reactive to light. Right eye exhibits no discharge. Left eye exhibits no discharge. No scleral icterus.  Neck: Normal range of motion. Neck supple. No thyromegaly present.  Cardiovascular: Normal rate, regular rhythm and intact distal pulses.   No murmur heard. The heart is regular at 60/m  Pulmonary/Chest: Effort normal and breath sounds normal. No respiratory distress. He has no wheezes.  He has no rales. He exhibits no tenderness.  Clear anteriorly and posteriorly  Abdominal: Soft. Bowel sounds are normal. He exhibits no mass. There is no tenderness. There is no rebound and no guarding.  No abdominal tenderness masses or organ enlargement  Genitourinary: Rectum normal and penis normal.  The prostate was slightly enlarged but smooth without lumps or masses. There were no rectal masses. There is no inguinal adenopathy. The external genitalia were within normal limits. It may have been a very small inguinal hernia on the right side but very small. Otherwise the testicles were normal external genitalia were normal.  Musculoskeletal: Normal range of motion. He exhibits no edema or tenderness.  Lymphadenopathy:    He has no cervical adenopathy.  Neurological: He is alert and oriented to person, place, and time. He has normal reflexes. No cranial nerve deficit.  Skin: Skin is warm and dry. No rash noted.  Psychiatric: He has a normal mood and affect. His behavior is normal. Judgment and thought content normal.  Nursing note and vitals reviewed.  BP 123/70 mmHg  Pulse 63  Temp(Src) 97.6 F (36.4 C) (Oral)  Ht '5\' 4"'  (1.626 m)  Wt 157 lb (71.215 kg)  BMI 26.94 kg/m2  [facility] reading (PRIMARY) by  Dr.Moore-chest x-ray with results pending  EKG: Within normal limits                                        Assessment & Plan:  1. Hyperlipidemia -Continue with Crestor and aggressive therapeutic lifestyle changes which include diet and exercise pending results of lab work - CBC with Differential/Platelet - NMR, lipoprofile - EKG 12-Lead - Exercise Tolerance Test; Future  2. Essential hypertension -The blood pressure is good here in the office today and he says his home readings are very similar. He will continue with current treatment. - DG Chest 2 View; Future - BMP8+EGFR - CBC with Differential/Platelet - Hepatic function panel - EKG 12-Lead - Exercise Tolerance Test;  Future  3. Testosterone deficiency -The patient is having no symptoms with testosterone deficiency. His last levels were low. He will continue with 1 puff to each arm daily of AndroGel pending results of lab work. - CBC with Differential/Platelet - PSA, total and free - Testosterone,Free and Total - Urinalysis, Complete  4. Vitamin D deficiency -Continue current treatment pending results of lab work - CBC with Differential/Platelet - VITAMIN D 25 Hydroxy (Vit-D Deficiency, Fractures)  5. Family history of colon cancer -There is a positive family history for colon cancer in the patient's father. His last colonoscopy was in 2011. - Ambulatory referral to Gastroenterology  6. Screen for colon cancer -FOBT - Ambulatory referral to Gastroenterology  Patient Instructions  Continue current medications. Continue good therapeutic lifestyle changes which include good diet and exercise. Fall precautions discussed with patient. If an FOBT was given today- please return it to our front desk. If you are over 40 years old - you may need Prevnar 23 or the adult Pneumonia  vaccine.   After your visit with [place] today you will receive a survey in the mail or online from [employer] regarding your care with [place]. Please take a moment to fill this out. Your feedback is very important to [place] as you can help [place] better understand your patient needs as well as improve your experience and satisfaction. WE CARE ABOUT YOU!!!   We will arrange for you to have a colonoscopy because of your family history of colon cancer and the fact that her last colonoscopy was in 2011. Always keep her stools checked for blood loss Stay active and drink plenty of fluids Eat healthy and keep weight down to a minimum. Your body mass index is 27 and ideally it would be lower at 25   Arrie Senate MD

## 2015-09-19 ENCOUNTER — Other Ambulatory Visit: Payer: PRIVATE HEALTH INSURANCE

## 2015-09-19 DIAGNOSIS — E349 Endocrine disorder, unspecified: Secondary | ICD-10-CM

## 2015-09-19 DIAGNOSIS — E559 Vitamin D deficiency, unspecified: Secondary | ICD-10-CM

## 2015-09-19 DIAGNOSIS — I10 Essential (primary) hypertension: Secondary | ICD-10-CM

## 2015-09-19 DIAGNOSIS — E785 Hyperlipidemia, unspecified: Secondary | ICD-10-CM

## 2015-09-21 LAB — CBC WITH DIFFERENTIAL/PLATELET
BASOS ABS: 0 10*3/uL (ref 0.0–0.2)
Basos: 0 %
EOS (ABSOLUTE): 0.2 10*3/uL (ref 0.0–0.4)
EOS: 4 %
HEMATOCRIT: 47.2 % (ref 37.5–51.0)
Hemoglobin: 15.6 g/dL (ref 12.6–17.7)
IMMATURE GRANULOCYTES: 0 %
Immature Grans (Abs): 0 10*3/uL (ref 0.0–0.1)
Lymphocytes Absolute: 1.7 10*3/uL (ref 0.7–3.1)
Lymphs: 33 %
MCH: 31.9 pg (ref 26.6–33.0)
MCHC: 33.1 g/dL (ref 31.5–35.7)
MCV: 97 fL (ref 79–97)
MONOS ABS: 0.4 10*3/uL (ref 0.1–0.9)
Monocytes: 7 %
NEUTROS PCT: 56 %
Neutrophils Absolute: 3 10*3/uL (ref 1.4–7.0)
PLATELETS: 200 10*3/uL (ref 150–379)
RBC: 4.89 x10E6/uL (ref 4.14–5.80)
RDW: 13.4 % (ref 12.3–15.4)
WBC: 5.4 10*3/uL (ref 3.4–10.8)

## 2015-09-21 LAB — TESTOSTERONE,FREE AND TOTAL
TESTOSTERONE FREE: 10.3 pg/mL (ref 7.2–24.0)
Testosterone: 219 ng/dL — ABNORMAL LOW (ref 348–1197)

## 2015-09-21 LAB — PSA, TOTAL AND FREE
PROSTATE SPECIFIC AG, SERUM: 1.5 ng/mL (ref 0.0–4.0)
PSA FREE PCT: 45.3 %
PSA FREE: 0.68 ng/mL

## 2015-09-21 LAB — HEPATIC FUNCTION PANEL
ALT: 24 IU/L (ref 0–44)
AST: 21 IU/L (ref 0–40)
Albumin: 4.3 g/dL (ref 3.5–5.5)
Alkaline Phosphatase: 59 IU/L (ref 39–117)
Bilirubin Total: 0.4 mg/dL (ref 0.0–1.2)
Bilirubin, Direct: 0.12 mg/dL (ref 0.00–0.40)
Total Protein: 6.7 g/dL (ref 6.0–8.5)

## 2015-09-21 LAB — BMP8+EGFR
BUN/Creatinine Ratio: 14 (ref 9–20)
BUN: 13 mg/dL (ref 6–24)
CALCIUM: 9.3 mg/dL (ref 8.7–10.2)
CO2: 24 mmol/L (ref 18–29)
Chloride: 102 mmol/L (ref 96–106)
Creatinine, Ser: 0.93 mg/dL (ref 0.76–1.27)
GFR calc Af Amer: 108 mL/min/{1.73_m2} (ref >59)
GFR calc non Af Amer: 93 mL/min/{1.73_m2} (ref >59)
GLUCOSE: 99 mg/dL (ref 65–99)
POTASSIUM: 4.7 mmol/L (ref 3.5–5.2)
Sodium: 143 mmol/L (ref 134–144)

## 2015-09-21 LAB — NMR, LIPOPROFILE
Cholesterol: 105 mg/dL (ref 100–199)
HDL CHOLESTEROL BY NMR: 41 mg/dL (ref >39)
HDL PARTICLE NUMBER: 30.8 umol/L (ref >=30.5)
LDL Particle Number: 787 nmol/L (ref <1000)
LDL Size: 20.2 nm (ref >20.5)
LDL-C: 39 mg/dL (ref 0–99)
LP-IR Score: 73 — ABNORMAL HIGH (ref <=45)
Small LDL Particle Number: 422 nmol/L (ref <=527)
TRIGLYCERIDES BY NMR: 126 mg/dL (ref 0–149)

## 2015-09-21 LAB — VITAMIN D 25 HYDROXY (VIT D DEFICIENCY, FRACTURES): Vit D, 25-Hydroxy: 42.8 ng/mL (ref 30.0–100.0)

## 2015-09-22 ENCOUNTER — Other Ambulatory Visit: Payer: PRIVATE HEALTH INSURANCE

## 2015-09-22 DIAGNOSIS — Z0000 Encounter for general adult medical examination without abnormal findings: Secondary | ICD-10-CM

## 2015-09-25 ENCOUNTER — Telehealth: Payer: Self-pay | Admitting: *Deleted

## 2015-09-25 LAB — FECAL OCCULT BLOOD, IMMUNOCHEMICAL: FECAL OCCULT BLD: NEGATIVE

## 2015-09-25 NOTE — Telephone Encounter (Signed)
-----   Message from Worthy Rancher, MD sent at 09/23/2015  9:12 AM EDT ----- Regarding: RE: treadmill Yes good to schedule ----- Message -----    From: Thana Ates, LPN    Sent: [id number]  11:47 AM      To: Georga Kaufmann, LPN, Theron Arista, RN, # Subject: treadmill                                      Please review for treadmill

## 2015-09-25 NOTE — Telephone Encounter (Signed)
Lmtcb to schedule treadmill and ask for Minerva Ends or [place].

## 2015-10-14 ENCOUNTER — Telehealth: Payer: Self-pay | Admitting: Family Medicine

## 2015-10-19 ENCOUNTER — Ambulatory Visit (INDEPENDENT_AMBULATORY_CARE_PROVIDER_SITE_OTHER): Payer: PRIVATE HEALTH INSURANCE | Admitting: Family Medicine

## 2015-10-19 ENCOUNTER — Encounter: Payer: Self-pay | Admitting: Family Medicine

## 2015-10-19 VITALS — BP 130/76 | HR 72 | Temp 98.3°F | Ht 64.0 in | Wt 152.0 lb

## 2015-10-19 DIAGNOSIS — R195 Other fecal abnormalities: Secondary | ICD-10-CM

## 2015-10-19 DIAGNOSIS — R194 Change in bowel habit: Secondary | ICD-10-CM | POA: Diagnosis not present

## 2015-10-19 DIAGNOSIS — B349 Viral infection, unspecified: Secondary | ICD-10-CM

## 2015-10-19 DIAGNOSIS — R140 Abdominal distension (gaseous): Secondary | ICD-10-CM

## 2015-10-19 DIAGNOSIS — R197 Diarrhea, unspecified: Secondary | ICD-10-CM

## 2015-10-19 LAB — CBC WITH DIFFERENTIAL/PLATELET
BASOS ABS: 0 10*3/uL (ref 0.0–0.2)
Basos: 0 %
EOS (ABSOLUTE): 0 10*3/uL (ref 0.0–0.4)
EOS: 0 %
Hematocrit: 50.6 % (ref 37.5–51.0)
Hemoglobin: 17.2 g/dL (ref 12.6–17.7)
IMMATURE GRANULOCYTES: 0 %
Immature Grans (Abs): 0 10*3/uL (ref 0.0–0.1)
Lymphocytes Absolute: 1.6 10*3/uL (ref 0.7–3.1)
Lymphs: 16 %
MCH: 32 pg (ref 26.6–33.0)
MCHC: 34 g/dL (ref 31.5–35.7)
MCV: 94 fL (ref 79–97)
MONOS ABS: 0.7 10*3/uL (ref 0.1–0.9)
Monocytes: 7 %
NEUTROS PCT: 77 %
Neutrophils Absolute: 7.3 10*3/uL — ABNORMAL HIGH (ref 1.4–7.0)
PLATELETS: 217 10*3/uL (ref 150–379)
RBC: 5.38 x10E6/uL (ref 4.14–5.80)
RDW: 13.3 % (ref 12.3–15.4)
WBC: 9.6 10*3/uL (ref 3.4–10.8)

## 2015-10-19 LAB — CMP14+EGFR
A/G RATIO: 1.7 (ref 1.2–2.2)
ALT: 30 IU/L (ref 0–44)
AST: 19 IU/L (ref 0–40)
Albumin: 4.5 g/dL (ref 3.5–5.5)
Alkaline Phosphatase: 67 IU/L (ref 39–117)
BILIRUBIN TOTAL: 0.3 mg/dL (ref 0.0–1.2)
BUN/Creatinine Ratio: 11 (ref 9–20)
BUN: 12 mg/dL (ref 6–24)
CO2: 26 mmol/L (ref 18–29)
Calcium: 10.2 mg/dL (ref 8.7–10.2)
Chloride: 100 mmol/L (ref 96–106)
Creatinine, Ser: 1.13 mg/dL (ref 0.76–1.27)
GFR calc Af Amer: 85 mL/min/{1.73_m2} (ref >59)
GFR calc non Af Amer: 74 mL/min/{1.73_m2} (ref >59)
GLOBULIN, TOTAL: 2.6 g/dL (ref 1.5–4.5)
Glucose: 114 mg/dL — ABNORMAL HIGH (ref 65–99)
POTASSIUM: 4.8 mmol/L (ref 3.5–5.2)
SODIUM: 142 mmol/L (ref 134–144)
Total Protein: 7.1 g/dL (ref 6.0–8.5)

## 2015-10-19 LAB — URINALYSIS, COMPLETE
BILIRUBIN UA: NEGATIVE
GLUCOSE, UA: NEGATIVE
Ketones, UA: NEGATIVE
Leukocytes, UA: NEGATIVE
Nitrite, UA: NEGATIVE
PROTEIN UA: NEGATIVE
SPEC GRAV UA: 1.02 (ref 1.005–1.030)
UUROB: 0.2 mg/dL (ref 0.2–1.0)
pH, UA: 5.5 (ref 5.0–7.5)

## 2015-10-19 LAB — MICROSCOPIC EXAMINATION
Bacteria, UA: NONE SEEN
Epithelial Cells (non renal): NONE SEEN /hpf (ref 0–10)
WBC UA: NONE SEEN /HPF (ref 0–?)

## 2015-10-19 NOTE — Progress Notes (Signed)
Subjective:    Patient ID: Jorge [name], male    DOB: 10-02-1960, 55 y.o.   MRN: [id number]  HPI Patient here today for bloating and abnormal Bowel movements. This started about 1 week ago after a beach trip.The patient denies any diarrhea or blood in the stool. He has not had any nausea vomiting or fever. The patient was at the beach for about 12 days. No one else is sick. He generally eats milk and dairy products but does not drink caffeine. He started feeling bloated with gas about 4 days after getting back from the beach. This is been going on since last Thursday which was 4 days ago. He denies any chest pain or shortness of breath. He denies any fever. He denies any problems with blood in the stool or black tarry bowel movements. He has no abdominal pain. The patient does admit to increased stress at the workplace recently. He is also due to get a colonoscopy but was told he cannot get a preventative colonoscopy. If he does not improve and if we get an ultrasound that is negative he may need to be scheduled with the gastroenterologist. We had this discussion with him.     Patient Active Problem List   Diagnosis Date Noted  . Nephrolithiasis 02/13/2014  . Metabolic syndrome 41/42/3953  . Hyperlipidemia 10/31/2012  . Hypertension 10/31/2012  . Testosterone deficiency 10/31/2012  . Vitamin D deficiency 10/31/2012   Outpatient Encounter Prescriptions as of 10/19/2015  Medication Sig  . cholecalciferol (VITAMIN D) 1000 UNITS tablet Take 2,000 Units by mouth daily.    . fish oil-omega-3 fatty acids 1000 MG capsule Take 2,400 mg by mouth daily.    [name] Kitchen HYDROcodone-acetaminophen (NORCO) 10-325 MG per tablet Take 1 tablet by mouth every 8 (eight) hours as needed.  [name] Kitchen ibuprofen (ADVIL,MOTRIN) 200 MG tablet Take 400 mg by mouth every 6 (six) hours as needed. For pain   . Magnesium 250 MG TABS Take 0.5 tablets by mouth daily.  . nebivolol (BYSTOLIC) 5 MG tablet Take 1 tablet (5 mg total) by mouth  daily.  . rosuvastatin (CRESTOR) 20 MG tablet Take 1 tablet (20 mg total) by mouth daily. Take one tab by mouth once a day as directed.  . Testosterone (ANDROGEL PUMP) 20.25 MG/ACT (1.62%) GEL Place 2 application onto the skin daily.   No facility-administered encounter medications on file as of 10/19/2015.     Review of Systems  Constitutional: Negative.   HENT: Negative.   Eyes: Negative.   Respiratory: Negative.   Cardiovascular: Negative.   Gastrointestinal: Positive for abdominal distention (bloating).       Soft stools   Endocrine: Negative.   Genitourinary: Negative.   Musculoskeletal: Negative.   Skin: Negative.   Allergic/Immunologic: Negative.   Neurological: Negative.   Hematological: Negative.   Psychiatric/Behavioral: Negative.        Objective:   Physical Exam  Constitutional: He is oriented to person, place, and time. He appears well-developed and well-nourished. No distress.  HENT:  Head: Normocephalic and atraumatic.  Right Ear: External ear normal.  Left Ear: External ear normal.  Nose: Nose normal.  Mouth/Throat: Oropharynx is clear and moist. No oropharyngeal exudate.  Eyes: Conjunctivae and EOM are normal. Pupils are equal, round, and reactive to light. Right eye exhibits no discharge. Left eye exhibits no discharge. No scleral icterus.  Neck: Normal range of motion. Neck supple. No thyromegaly present.  Cardiovascular: Normal rate, regular rhythm and normal heart sounds.   No  murmur heard. Pulmonary/Chest: Effort normal and breath sounds normal. No respiratory distress. He has no wheezes. He has no rales. He exhibits no tenderness.  Abdominal: Soft. Bowel sounds are normal. He exhibits no mass. There is no tenderness. There is no rebound and no guarding.  No liver or spleen enlargement. No tenderness to palpation. No masses. No bruits.  Musculoskeletal: Normal range of motion. He exhibits no edema.  Lymphadenopathy:    He has no cervical adenopathy.    Neurological: He is alert and oriented to person, place, and time.  Skin: Skin is warm and dry. No rash noted.  Psychiatric: He has a normal mood and affect. His behavior is normal. Judgment and thought content normal.  Nursing note and vitals reviewed.  BP 130/76 mmHg  Pulse 72  Temp(Src) 98.3 F (36.8 C) (Oral)  Ht _0  (1.626 m)  Wt 152 lb (68.947 kg)  BMI 26.08 kg/m2        Assessment & Plan:  1. Abdominal bloating -Follow diet as directed - CMP14+EGFR - CBC with Differential/Platelet - Urinalysis, Complete  2. Change in bowel habits -Get ultrasound and ultimately an appointment with gastroenterology if patient is not improved in another week. - CMP14+EGFR - CBC with Differential/Platelet - Urinalysis, Complete  3. Loose bowel movement -Work with diet as directed  4. Viral syndrome -Diet as directed  Patient Instructions  Clear liquids for 24 hours (like 7-Up, ginger ale, Sprite, Jello, frozen pops) Full liquids the second 24-hours (like potato soup, tomato soup, chicken noodle soup) Bland diet the third 24-hours (boiled and baked foods, no fried or greasy foods) Avoid milk, cheese, ice cream and dairy products for 72 hours. Avoid caffeine (cola drinks, coffee, tea, Mountain Dew, Mellow Yellow) Take in small amounts, but frequently. Tylenol and/or Advil as needed for aches pains and fever  If no better by this Friday, please call and have my nurse schedule an ultrasound of the abdomen and pelvis. If better continue to avoid milk cheese ice cream and dairy products for several days and gradually increase diet back to normal diet.    Arrie Senate MD

## 2015-10-19 NOTE — Patient Instructions (Addendum)
Clear liquids for 24 hours (like 7-Up, ginger ale, Sprite, Jello, frozen pops) Full liquids the second 24-hours (like potato soup, tomato soup, chicken noodle soup) Bland diet the third 24-hours (boiled and baked foods, no fried or greasy foods) Avoid milk, cheese, ice cream and dairy products for 72 hours. Avoid caffeine (cola drinks, coffee, tea, Mountain Dew, Mellow Yellow) Take in small amounts, but frequently. Tylenol and/or Advil as needed for aches pains and fever  If no better by this Friday, please call and have my nurse schedule an ultrasound of the abdomen and pelvis. If better continue to avoid milk cheese ice cream and dairy products for several days and gradually increase diet back to normal diet. We will call with results of lab work as these results become available

## 2015-10-28 LAB — URINALYSIS, COMPLETE
Bilirubin, UA: NEGATIVE
GLUCOSE, UA: NEGATIVE
Ketones, UA: NEGATIVE
LEUKOCYTES UA: NEGATIVE
Nitrite, UA: NEGATIVE
PROTEIN UA: NEGATIVE
RBC, UA: NEGATIVE
Specific Gravity, UA: 1.02 (ref 1.005–1.030)
UUROB: 0.2 mg/dL (ref 0.2–1.0)
pH, UA: 7 (ref 5.0–7.5)

## 2015-10-28 LAB — MICROSCOPIC EXAMINATION
Bacteria, UA: NONE SEEN
Epithelial Cells (non renal): NONE SEEN /hpf (ref 0–10)
RBC, UA: NONE SEEN /hpf (ref 0–?)
WBC UA: NONE SEEN /HPF (ref 0–?)

## 2015-11-02 NOTE — Telephone Encounter (Signed)
Appointment scheduled on 8/10 @ 8:30 for treadmill. Instructions mailed to patient.

## 2015-11-02 NOTE — Telephone Encounter (Signed)
Appointment scheduled for 11/26/2015 for treadmill and instructions mailed to patient.

## 2015-11-03 NOTE — Addendum Note (Signed)
Addended by: Zannie Cove on: 11/03/2015 05:01 PM   Modules accepted: Level of Service

## 2015-11-05 ENCOUNTER — Telehealth: Payer: Self-pay | Admitting: Family Medicine

## 2015-11-05 NOTE — Telephone Encounter (Signed)
This patient continues to have abdominal bloating and discomfort. Please refer to gastroenterologist and get appointment as soon as possible

## 2015-11-06 ENCOUNTER — Encounter: Payer: Self-pay | Admitting: Family Medicine

## 2015-11-06 ENCOUNTER — Ambulatory Visit (INDEPENDENT_AMBULATORY_CARE_PROVIDER_SITE_OTHER): Payer: PRIVATE HEALTH INSURANCE | Admitting: Family Medicine

## 2015-11-06 ENCOUNTER — Ambulatory Visit (HOSPITAL_COMMUNITY)
Admission: RE | Admit: 2015-11-06 | Discharge: 2015-11-06 | Disposition: A | Discharge status: Home / Self Care | Payer: PRIVATE HEALTH INSURANCE | Source: Ambulatory Visit | Attending: Family Medicine | Admitting: Family Medicine

## 2015-11-06 VITALS — BP 120/67 | HR 69 | Temp 98.5°F | Ht 64.0 in | Wt 153.0 lb

## 2015-11-06 DIAGNOSIS — R140 Abdominal distension (gaseous): Secondary | ICD-10-CM

## 2015-11-06 DIAGNOSIS — Z800 Family history of malignant neoplasm of digestive organs: Secondary | ICD-10-CM

## 2015-11-06 DIAGNOSIS — R1031 Right lower quadrant pain: Secondary | ICD-10-CM | POA: Insufficient documentation

## 2015-11-06 DIAGNOSIS — R194 Change in bowel habit: Secondary | ICD-10-CM

## 2015-11-06 DIAGNOSIS — N200 Calculus of kidney: Secondary | ICD-10-CM | POA: Insufficient documentation

## 2015-11-06 LAB — MICROSCOPIC EXAMINATION
Bacteria, UA: NONE SEEN
EPITHELIAL CELLS (NON RENAL): NONE SEEN /HPF (ref 0–10)
WBC UA: NONE SEEN /HPF (ref 0–?)

## 2015-11-06 LAB — URINALYSIS, COMPLETE
BILIRUBIN UA: NEGATIVE
GLUCOSE, UA: NEGATIVE
KETONES UA: NEGATIVE
Leukocytes, UA: NEGATIVE
Nitrite, UA: NEGATIVE
Protein, UA: NEGATIVE
SPEC GRAV UA: 1.02 (ref 1.005–1.030)
Urobilinogen, Ur: 0.2 mg/dL (ref 0.2–1.0)
pH, UA: 7.5 (ref 5.0–7.5)

## 2015-11-06 NOTE — Progress Notes (Signed)
Subjective:    Patient ID: Jorge [name], male    DOB: 20-Nov-1960, 55 y.o.   MRN: [id number]  HPI  Patient here today for RLQ abd pain that started about 7 am today and ended at 9 am. Now the only symptom he has is bloating. This is currently improving as we speak. The patient has an upcoming appointment with gastroenterology. The patient denies any trouble with heartburn indigestion nausea or vomiting blood in the stool or black tarry bowel movements. He does have a family history of colon cancer and he is due to get a colonoscopy as it has been slightly more than 5 years out since his last colonoscopy. He also has a history of renal stones but he is having no symptoms with passing his water blood in the urine etc. He says his stools are smaller in caliber but normal in color. They're softer than usual. His complaints of bloating and fullness and a full sensation after eating have been going on for about 4 weeks. His episode of right lower quadrant abdominal pain lasted for 2 hours this morning and this is the first episode of this. He does not have the appointment with the gastroenterologist at this point in time.     Patient Active Problem List   Diagnosis Date Noted  . Nephrolithiasis 02/13/2014  . Metabolic syndrome 97/53/0051  . Hyperlipidemia 10/31/2012  . Hypertension 10/31/2012  . Testosterone deficiency 10/31/2012  . Vitamin D deficiency 10/31/2012   Outpatient Encounter Prescriptions as of 11/06/2015  Medication Sig  . cholecalciferol (VITAMIN D) 1000 UNITS tablet Take 2,000 Units by mouth daily.    . fish oil-omega-3 fatty acids 1000 MG capsule Take 2,400 mg by mouth daily.    [name] Kitchen HYDROcodone-acetaminophen (NORCO) 10-325 MG per tablet Take 1 tablet by mouth every 8 (eight) hours as needed.  [name] Kitchen ibuprofen (ADVIL,MOTRIN) 200 MG tablet Take 400 mg by mouth every 6 (six) hours as needed. For pain   . Magnesium 250 MG TABS Take 0.5 tablets by mouth daily.  . nebivolol (BYSTOLIC) 5 MG  tablet Take 1 tablet (5 mg total) by mouth daily.  . rosuvastatin (CRESTOR) 20 MG tablet Take 1 tablet (20 mg total) by mouth daily. Take one tab by mouth once a day as directed.  . Testosterone (ANDROGEL PUMP) 20.25 MG/ACT (1.62%) GEL Place 2 application onto the skin daily.   No facility-administered encounter medications on file as of 11/06/2015.     Review of Systems  Constitutional: Negative.   HENT: Negative.   Eyes: Negative.   Respiratory: Negative.   Cardiovascular: Negative.   Gastrointestinal: Positive for abdominal pain (RLQ pain - better now).       Bloating  Endocrine: Negative.   Genitourinary: Negative.   Musculoskeletal: Negative.   Skin: Negative.   Allergic/Immunologic: Negative.   Neurological: Negative.   Hematological: Negative.   Psychiatric/Behavioral: Negative.        Objective:   Physical Exam  Constitutional: He is oriented to person, place, and time. He appears well-developed and well-nourished. No distress.  HENT:  Head: Normocephalic and atraumatic.  Eyes: Conjunctivae and EOM are normal. Pupils are equal, round, and reactive to light. Right eye exhibits no discharge. Left eye exhibits no discharge. No scleral icterus.  Neck: Normal range of motion. Neck supple. No thyromegaly present.  Cardiovascular: Normal rate, regular rhythm and normal heart sounds.   No murmur heard. Pulmonary/Chest: Effort normal and breath sounds normal. No respiratory distress. He has no wheezes. He  has no rales. He exhibits no tenderness.  No axillary adenopathy  Abdominal: Soft. Bowel sounds are normal. He exhibits no mass. There is no tenderness. There is no rebound and no guarding.  Generalized abdominal tenderness with no specific site of severe tenderness to palpation. There is no liver or spleen enlargement. There is no inguinal adenopathy.  Genitourinary: Rectum normal.  Musculoskeletal: Normal range of motion. He exhibits no edema.  Lymphadenopathy:    He has no  cervical adenopathy.  Neurological: He is alert and oriented to person, place, and time.  Skin: Skin is warm and dry. No rash noted.  Psychiatric: He has a normal mood and affect. His behavior is normal. Judgment and thought content normal.  Nursing note and vitals reviewed.  BP 120/67 mmHg  Pulse 69  Temp(Src) 98.5 F (36.9 C) (Oral)  Ht '5\' 4"'  (1.626 m)  Wt 153 lb (69.4 kg)  BMI 26.25 kg/m2        Assessment & Plan:  1. Change in bowel habits - CT Abdomen Pelvis Wo Contrast; Future - CBC with Differential/Platelet - Fecal occult blood, imunochemical; Future - BMP8+EGFR - Urinalysis, Complete - Urine culture  2. RLQ abdominal pain -Drink plenty of fluids and avoid caffeine and milk cheese ice cream and dairy products. Take extra strength Tylenol if needed for pain - CT Abdomen Pelvis Wo Contrast; Future - CBC with Differential/Platelet - Fecal occult blood, imunochemical; Future - BMP8+EGFR - Urinalysis, Complete - Urine culture  3. Bloating -Watch diet more closely and drink plenty of fluids - CT Abdomen Pelvis Wo Contrast; Future - CBC with Differential/Platelet - Fecal occult blood, imunochemical; Future - BMP8+EGFR - Urinalysis, Complete - Urine culture  4. Family history of colon cancer -Patient will need a colonoscopy at some point sooner than later because of his family history of colon cancer in his father. - CT Abdomen Pelvis Wo Contrast; Future - Fecal occult blood, imunochemical; Future  Patient Instructions  We will get CT scan of abdomen and pelvis as planned Continue to drink plenty of fluids and avoid caffeine and milk cheese ice cream and dairy products We will call you with lab work results as soon as these become available I will speak with the gastroenterologist on Tuesday and if you have not heard from me by Wednesday please give me a call back   Arrie Senate MD

## 2015-11-06 NOTE — Patient Instructions (Signed)
We will get CT scan of abdomen and pelvis as planned Continue to drink plenty of fluids and avoid caffeine and milk cheese ice cream and dairy products We will call you with lab work results as soon as these become available I will speak with the gastroenterologist on Tuesday and if you have not heard from me by Wednesday please give me a call back

## 2015-11-07 LAB — CBC WITH DIFFERENTIAL/PLATELET
BASOS ABS: 0 10*3/uL (ref 0.0–0.2)
Basos: 0 %
EOS (ABSOLUTE): 0.1 10*3/uL (ref 0.0–0.4)
Eos: 1 %
Hematocrit: 48.5 % (ref 37.5–51.0)
Hemoglobin: 16.3 g/dL (ref 12.6–17.7)
IMMATURE GRANULOCYTES: 0 %
Immature Grans (Abs): 0 10*3/uL (ref 0.0–0.1)
LYMPHS: 18 %
Lymphocytes Absolute: 1.4 10*3/uL (ref 0.7–3.1)
MCH: 31.5 pg (ref 26.6–33.0)
MCHC: 33.6 g/dL (ref 31.5–35.7)
MCV: 94 fL (ref 79–97)
MONOS ABS: 0.5 10*3/uL (ref 0.1–0.9)
Monocytes: 6 %
NEUTROS PCT: 75 %
Neutrophils Absolute: 5.8 10*3/uL (ref 1.4–7.0)
PLATELETS: 189 10*3/uL (ref 150–379)
RBC: 5.17 x10E6/uL (ref 4.14–5.80)
RDW: 12.9 % (ref 12.3–15.4)
WBC: 7.7 10*3/uL (ref 3.4–10.8)

## 2015-11-07 LAB — BMP8+EGFR
BUN / CREAT RATIO: 10 (ref 9–20)
BUN: 10 mg/dL (ref 6–24)
CALCIUM: 9.5 mg/dL (ref 8.7–10.2)
CHLORIDE: 99 mmol/L (ref 96–106)
CO2: 25 mmol/L (ref 18–29)
CREATININE: 1 mg/dL (ref 0.76–1.27)
GFR calc Af Amer: 99 mL/min/{1.73_m2} (ref >59)
GFR calc non Af Amer: 86 mL/min/{1.73_m2} (ref >59)
GLUCOSE: 137 mg/dL — AB (ref 65–99)
Potassium: 4.7 mmol/L (ref 3.5–5.2)
Sodium: 142 mmol/L (ref 134–144)

## 2015-11-07 LAB — URINE CULTURE

## 2015-11-09 ENCOUNTER — Other Ambulatory Visit: Payer: PRIVATE HEALTH INSURANCE

## 2015-11-09 DIAGNOSIS — R194 Change in bowel habit: Secondary | ICD-10-CM

## 2015-11-09 DIAGNOSIS — R140 Abdominal distension (gaseous): Secondary | ICD-10-CM

## 2015-11-09 DIAGNOSIS — Z800 Family history of malignant neoplasm of digestive organs: Secondary | ICD-10-CM

## 2015-11-09 DIAGNOSIS — R1031 Right lower quadrant pain: Secondary | ICD-10-CM

## 2015-11-09 NOTE — Telephone Encounter (Signed)
Spoke with pt's wife and she states they had seen Dr.Moore this past Friday and he is taking care of this. Will close encounter.

## 2015-11-10 ENCOUNTER — Telehealth: Payer: Self-pay | Admitting: Gastroenterology

## 2015-11-10 LAB — FECAL OCCULT BLOOD, IMMUNOCHEMICAL: FECAL OCCULT BLD: NEGATIVE

## 2015-11-10 NOTE — Telephone Encounter (Signed)
Received [facility] records and placed on Dr. Doyne Keel desk for review.

## 2015-11-11 NOTE — Telephone Encounter (Signed)
Patient scheduled for 12/02/15 office visit with Dr Hilarie Fredrickson. He verbalizes understanding.

## 2015-11-11 NOTE — Telephone Encounter (Signed)
-----   Message from Jerene Bears, MD sent at 11/11/2015  3:29 PM EDT ----- Regarding: New appt Dr. Morrie Sheldon contacted me directly about expediting this patient's consult visit Could work him in on the morning of 12/02/15 Thanks JMP

## 2015-11-20 ENCOUNTER — Encounter: Payer: Self-pay | Admitting: *Deleted

## 2015-11-26 ENCOUNTER — Other Ambulatory Visit: Payer: Self-pay | Admitting: *Deleted

## 2015-11-26 ENCOUNTER — Other Ambulatory Visit (INDEPENDENT_AMBULATORY_CARE_PROVIDER_SITE_OTHER): Payer: PRIVATE HEALTH INSURANCE

## 2015-11-26 DIAGNOSIS — I10 Essential (primary) hypertension: Secondary | ICD-10-CM

## 2015-11-26 DIAGNOSIS — R9431 Abnormal electrocardiogram [ECG] [EKG]: Secondary | ICD-10-CM

## 2015-11-26 DIAGNOSIS — R9439 Abnormal result of other cardiovascular function study: Secondary | ICD-10-CM

## 2015-11-26 DIAGNOSIS — E785 Hyperlipidemia, unspecified: Secondary | ICD-10-CM | POA: Diagnosis not present

## 2015-11-26 NOTE — Progress Notes (Unsigned)
0

## 2015-11-27 LAB — EXERCISE TOLERANCE TEST
CHL CUP MPHR: 167 {beats}/min
CHL RATE OF PERCEIVED EXERTION: 7
Estimated workload: 9.6 METS
Exercise duration (min): 8 min
Exercise duration (sec): 39 s
Peak HR: 142 {beats}/min
Percent HR: 85 %
Rest HR: 60 {beats}/min

## 2015-12-02 ENCOUNTER — Encounter: Payer: Self-pay | Admitting: Internal Medicine

## 2015-12-02 ENCOUNTER — Encounter (INDEPENDENT_AMBULATORY_CARE_PROVIDER_SITE_OTHER): Payer: Self-pay

## 2015-12-02 ENCOUNTER — Ambulatory Visit (INDEPENDENT_AMBULATORY_CARE_PROVIDER_SITE_OTHER): Payer: 59 | Admitting: Internal Medicine

## 2015-12-02 ENCOUNTER — Other Ambulatory Visit (INDEPENDENT_AMBULATORY_CARE_PROVIDER_SITE_OTHER): Payer: 59

## 2015-12-02 VITALS — BP 140/78 | HR 68 | Ht 64.0 in | Wt 153.0 lb

## 2015-12-02 DIAGNOSIS — R198 Other specified symptoms and signs involving the digestive system and abdomen: Secondary | ICD-10-CM

## 2015-12-02 DIAGNOSIS — Z800 Family history of malignant neoplasm of digestive organs: Secondary | ICD-10-CM | POA: Diagnosis not present

## 2015-12-02 DIAGNOSIS — R143 Flatulence: Secondary | ICD-10-CM

## 2015-12-02 DIAGNOSIS — IMO0001 Reserved for inherently not codable concepts without codable children: Secondary | ICD-10-CM

## 2015-12-02 DIAGNOSIS — R140 Abdominal distension (gaseous): Secondary | ICD-10-CM | POA: Diagnosis not present

## 2015-12-02 DIAGNOSIS — R194 Change in bowel habit: Secondary | ICD-10-CM

## 2015-12-02 LAB — CBC WITH DIFFERENTIAL/PLATELET
Basophils Absolute: 0 10*3/uL (ref 0.0–0.1)
Basophils Relative: 0.4 % (ref 0.0–3.0)
EOS PCT: 0.5 % (ref 0.0–5.0)
Eosinophils Absolute: 0 10*3/uL (ref 0.0–0.7)
HCT: 50.1 % (ref 39.0–52.0)
Hemoglobin: 16.9 g/dL (ref 13.0–17.0)
LYMPHS ABS: 1.5 10*3/uL (ref 0.7–4.0)
Lymphocytes Relative: 16.5 % (ref 12.0–46.0)
MCHC: 33.8 g/dL (ref 30.0–36.0)
MCV: 93.2 fl (ref 78.0–100.0)
MONOS PCT: 7.1 % (ref 3.0–12.0)
Monocytes Absolute: 0.6 10*3/uL (ref 0.1–1.0)
NEUTROS ABS: 6.8 10*3/uL (ref 1.4–7.7)
NEUTROS PCT: 75.5 % (ref 43.0–77.0)
Platelets: 202 10*3/uL (ref 150.0–400.0)
RBC: 5.37 Mil/uL (ref 4.22–5.81)
RDW: 12.5 % (ref 11.5–15.5)
WBC: 9 10*3/uL (ref 4.0–10.5)

## 2015-12-02 LAB — COMPREHENSIVE METABOLIC PANEL
ALBUMIN: 4.6 g/dL (ref 3.5–5.2)
ALT: 29 U/L (ref 0–53)
AST: 19 U/L (ref 0–37)
Alkaline Phosphatase: 57 U/L (ref 39–117)
BUN: 12 mg/dL (ref 6–23)
CALCIUM: 9.8 mg/dL (ref 8.4–10.5)
CHLORIDE: 103 meq/L (ref 96–112)
CO2: 31 mEq/L (ref 19–32)
CREATININE: 1.03 mg/dL (ref 0.40–1.50)
GFR: 80.08 mL/min (ref >60.00)
Glucose, Bld: 117 mg/dL — ABNORMAL HIGH (ref 70–99)
Potassium: 5 mEq/L (ref 3.5–5.1)
Sodium: 140 mEq/L (ref 135–145)
Total Bilirubin: 0.4 mg/dL (ref 0.2–1.2)
Total Protein: 7.4 g/dL (ref 6.0–8.3)

## 2015-12-02 LAB — IGA: IGA: 141 mg/dL (ref 68–378)

## 2015-12-02 LAB — TSH: TSH: 1.14 u[IU]/mL (ref 0.35–4.50)

## 2015-12-02 MED ORDER — NA SULFATE-K SULFATE-MG SULF 17.5-3.13-1.6 GM/177ML PO SOLN: ORAL | 0 refills | Status: DC

## 2015-12-02 NOTE — Patient Instructions (Addendum)
Your physician has requested that you go to the basement for the following lab work before leaving today: CBC, CMP, Celiac, TSH  You have been scheduled for an endoscopy and colonoscopy. Please follow the written instructions given to you at your visit today. Please pick up your prep supplies at the pharmacy within the next 1-3 days. If you use inhalers (even only as needed), please bring them with you on the day of your procedure. Your physician has requested that you go to www.startemmi.com and enter the access code given to you at your visit today. This web site gives a general overview about your procedure. However, you should still follow specific instructions given to you by our office regarding your preparation for the procedure.  If you are age 73 or older, your body mass index should be between 23-30. Your Body mass index is 26.26 kg/m. If this is out of the aforementioned range listed, please consider follow up with your Primary Care Provider.  If you are age 64 or younger, your body mass index should be between 19-25. Your Body mass index is 26.26 kg/m. If this is out of the aformentioned range listed, please consider follow up with your Primary Care Provider.

## 2015-12-02 NOTE — Progress Notes (Signed)
Patient ID: Jorge [name], male   DOB: 10/18/1960, 55 y.o.   MRN: [id number] HPI: Jorge [name] is a 55 year old male with a past medical history of hypertension, hyperlipidemia and family history of colon cancer who seen in consultation at the request of Dr. Laurance Flatten to evaluate abdominal bloating with change in bowel habit. He is here alone today. He reports that over the last several months he's been feeling gassiness, upper abdominal bloating and a fullness more quickly with eating. This has not prevented him from eating a full meal he just feels more full more quickly. He's noticed that his bowel movements over the last few months of also become more loose and somewhat smaller, disintegrating after passage. No blood in his stool or melena. At times he's had a "queasy" stomach but denies vomiting. Denies heartburn. No abdominal pain just more discomfort with the full feeling. His dad had colorectal cancer and then lived another 28 years after surgery. He feels that his dad was fairly young at diagnosis. He had a colonoscopy performed by Dr. Paulita Fujita on 04/20/2009 which was normal and repeat recommended in 5 years based on family history.  Dr. Laurance Flatten ordered a CT scan for further evaluation which was performed on 11/06/2015. This was normal except for nonobstructing bilateral renal stones. The liver, gallbladder and pancreas appeared normal. Stomach and bowel appeared normal.  He had possible EKG changes at stress test with primary care and has an appointment with cardiology next week.  Past Medical History:  Diagnosis Date  . Abnormal liver enzymes   . Hyperlipidemia   . Hypertension   . Testosterone deficiency     Past Surgical History:  Procedure Laterality Date  . LITHOTRIPSY    . VASECTOMY      Outpatient Medications Prior to Visit  Medication Sig Dispense Refill  . cholecalciferol (VITAMIN D) 1000 UNITS tablet Take 2,000 Units by mouth daily.      . fish oil-omega-3 fatty acids 1000 MG  capsule Take 2,400 mg by mouth daily.      [name] Kitchen HYDROcodone-acetaminophen (NORCO) 10-325 MG per tablet Take 1 tablet by mouth every 8 (eight) hours as needed. 30 tablet 0  . ibuprofen (ADVIL,MOTRIN) 200 MG tablet Take 400 mg by mouth every 6 (six) hours as needed. For pain     . Magnesium 250 MG TABS Take 0.5 tablets by mouth daily.    . nebivolol (BYSTOLIC) 5 MG tablet Take 1 tablet (5 mg total) by mouth daily. 60 tablet 3  . rosuvastatin (CRESTOR) 20 MG tablet Take 1 tablet (20 mg total) by mouth daily. Take one tab by mouth once a day as directed. 60 tablet 4  . Testosterone (ANDROGEL PUMP) 20.25 MG/ACT (1.62%) GEL Place 2 application onto the skin daily. 150 g 3   No facility-administered medications prior to visit.     Allergies  Allergen Reactions  . Penicillins Rash    Family History  Problem Relation Age of Onset  . Heart disease Mother   . Hypertension Mother   . Diabetes Father   . Colon cancer Father     Social History  Substance Use Topics  . Smoking status: Never Smoker  . Smokeless tobacco: Never Used  . Alcohol use No    ROS: As per history of present illness, otherwise negative  BP 140/78 (BP Location: Left Arm, Patient Position: Sitting, Cuff Size: Normal)   Pulse 68   Ht 5\' 4"  (1.626 m)   Wt 153 lb (69.4 kg)  BMI 26.26 kg/m  Constitutional: Well-developed and well-nourished. No distress. HEENT: Normocephalic and atraumatic. Oropharynx is clear and moist. No oropharyngeal exudate. Conjunctivae are normal.  No scleral icterus. Neck: Neck supple. Trachea midline. Cardiovascular: Normal rate, regular rhythm and intact distal pulses. No M/R/G Pulmonary/chest: Effort normal and breath sounds normal. No wheezing, rales or rhonchi. Abdominal: Soft, nontender, nondistended. Bowel sounds active throughout. There are no masses palpable. No hepatosplenomegaly. Extremities: no clubbing, cyanosis, or edema Lymphadenopathy: No cervical adenopathy noted. Neurological:  Alert and oriented to person place and time. Skin: Skin is warm and dry. No rashes noted. Psychiatric: Normal mood and affect. Behavior is normal.  RELEVANT LABS AND IMAGING: CBC    Component Value Date/Time   WBC 9.0 12/02/2015 1130   RBC 5.37 12/02/2015 1130   HGB 16.9 12/02/2015 1130   HCT 50.1 12/02/2015 1130   HCT 48.5 11/06/2015 1050   PLT 202.0 12/02/2015 1130   PLT 189 11/06/2015 1050   MCV 93.2 12/02/2015 1130   MCV 94 11/06/2015 1050   MCH 31.5 11/06/2015 1050   MCH 30.3 08/19/2014 0929   MCH 31.8 03/31/2011 1536   MCHC 33.8 12/02/2015 1130   RDW 12.5 12/02/2015 1130   RDW 12.9 11/06/2015 1050   LYMPHSABS 1.5 12/02/2015 1130   LYMPHSABS 1.4 11/06/2015 1050   MONOABS 0.6 12/02/2015 1130   EOSABS 0.0 12/02/2015 1130   EOSABS 0.1 11/06/2015 1050   BASOSABS 0.0 12/02/2015 1130   BASOSABS 0.0 11/06/2015 1050    CMP     Component Value Date/Time   NA 140 12/02/2015 1130   NA 142 11/06/2015 1050   K 5.0 12/02/2015 1130   CL 103 12/02/2015 1130   CO2 31 12/02/2015 1130   GLUCOSE 117 (H) 12/02/2015 1130   BUN 12 12/02/2015 1130   BUN 10 11/06/2015 1050   CREATININE 1.03 12/02/2015 1130   CREATININE 1.03 11/01/2012 0826   CALCIUM 9.8 12/02/2015 1130   PROT 7.4 12/02/2015 1130   PROT 7.1 10/19/2015 1234   ALBUMIN 4.6 12/02/2015 1130   ALBUMIN 4.5 10/19/2015 1234   AST 19 12/02/2015 1130   ALT 29 12/02/2015 1130   ALKPHOS 57 12/02/2015 1130   BILITOT 0.4 12/02/2015 1130   BILITOT 0.3 10/19/2015 1234   GFRNONAA 86 11/06/2015 1050   GFRNONAA 84 11/01/2012 0826   GFRAA 99 11/06/2015 1050   GFRAA >89 11/01/2012 0826    ASSESSMENT/PLAN: 55 year old male with a past medical history of hypertension, hyperlipidemia and family history of colon cancer who seen in consultation at the request of Dr. Laurance Flatten to evaluate abdominal bloating with change in bowel habit  1. Upper abdominal bloating/early satiety/change in bowel habits/family history of colon cancer -- I  recommended upper endoscopy and colonoscopy for further evaluation of his symptoms. We discussed the risks, benefits and alternatives and he wishes to proceed. He will complete cardiology evaluation first. Labs and cross-sectional imaging ordered by Dr. Laurance Flatten are reassuring. Symptoms may be secondary to gastritis, bacterial overgrowth, postinfectious irritable bowel. Further recommendations after endoscopies.    [id number] [place] [address] [place], [place] [address]

## 2015-12-03 LAB — TISSUE TRANSGLUTAMINASE, IGA: Tissue Transglutaminase Ab, IgA: 1 U/mL (ref <4)

## 2015-12-07 ENCOUNTER — Encounter: Payer: Self-pay | Admitting: Internal Medicine

## 2015-12-07 NOTE — Progress Notes (Signed)
Cardiology Office Note   Date:  12/10/2015   ID:  Jorge [name], DOB 12-12-1960, MRN [id number]  PCP:  Redge Gainer, MD  Cardiologist:   Minus Breeding, MD   Chief Complaint  Patient presents with  . Abnormal Stress Test      History of Present Illness: Jorge [name] is a 55 y.o. male who presents for Evaluation of an abnormal stress test.  He recently had an abnormal POET (Plain Old Exercise Treadmill) for screening purposes because he has risk factors. However, he's not had any cardiac history other than some palpitations and a negative monitor years ago. He is very active.  The patient denies any new symptoms such as chest discomfort, neck or arm discomfort. There has been no new shortness of breath, PND or orthopnea. There have been no reported palpitations, presyncope or syncope.  Past Medical History:  Diagnosis Date  . Abnormal liver enzymes   . Hyperlipidemia   . Hypertension   . Testosterone deficiency     Past Surgical History:  Procedure Laterality Date  . LITHOTRIPSY    . VASECTOMY       Current Outpatient Prescriptions  Medication Sig Dispense Refill  . cholecalciferol (VITAMIN D) 1000 UNITS tablet Take 2,000 Units by mouth daily.      . fish oil-omega-3 fatty acids 1000 MG capsule Take 1,200 mg by mouth daily.     [name] Kitchen HYDROcodone-acetaminophen (NORCO) 10-325 MG per tablet Take 1 tablet by mouth every 8 (eight) hours as needed. 30 tablet 0  . ibuprofen (ADVIL,MOTRIN) 200 MG tablet Take 400 mg by mouth every 6 (six) hours as needed. For pain     . Magnesium 500 MG TABS Take by mouth.    . Na Sulfate-K Sulfate-Mg Sulf 17.5-3.13-1.6 GM/180ML SOLN Suprep-Use as directed 354 mL 0  . nebivolol (BYSTOLIC) 5 MG tablet Take 1 tablet (5 mg total) by mouth daily. (Patient taking differently: Take 2.5 mg by mouth daily. ) 60 tablet 3  . rosuvastatin (CRESTOR) 20 MG tablet Take 1 tablet (20 mg total) by mouth daily. Take one tab by mouth once a day as directed. 60  tablet 4  . Testosterone (ANDROGEL PUMP) 20.25 MG/ACT (1.62%) GEL Place 2 application onto the skin daily. 150 g 3   No current facility-administered medications for this visit.     Allergies:   Penicillins    Social History:  The patient  reports that he has never smoked. He has never used smokeless tobacco. He reports that he does not drink alcohol or use drugs.   Family History:  The patient's family history includes Colon cancer in his father; Diabetes in his father; Heart disease in his mother; Hypertension in his mother.    ROS:  Please see the history of present illness.   Otherwise, review of systems are positive for none.   All other systems are reviewed and negative.    PHYSICAL EXAM: VS:  BP 124/72   Pulse 65   Ht 5\' 6"  (1.676 m)   Wt 152 lb (68.9 kg)   BMI 24.53 kg/m  , BMI Body mass index is 24.53 kg/m. GENERAL:  Well appearing HEENT:  Pupils equal round and reactive, fundi not visualized, oral mucosa unremarkable NECK:  No jugular venous distention, waveform within normal limits, carotid upstroke brisk and symmetric, no bruits, no thyromegaly LYMPHATICS:  No cervical, inguinal adenopathy LUNGS:  Clear to auscultation bilaterally BACK:  No CVA tenderness CHEST:  Unremarkable HEART:  PMI not  displaced or sustained,S1 and S2 within normal limits, no S3, no S4, no clicks, no rubs, no murmurs ABD:  Flat, positive bowel sounds normal in frequency in pitch, no bruits, no rebound, no guarding, no midline pulsatile mass, no hepatomegaly, no splenomegaly EXT:  2 plus pulses throughout, no edema, no cyanosis no clubbing SKIN:  No rashes no nodules NEURO:  Cranial nerves II through XII grossly intact, motor grossly intact throughout PSYCH:  Cognitively intact, oriented to person place and time    EKG:  EKG is not ordered today. The ekg ordered 6/2/17demonstrates sinus bradycardia, rate 59, axis within normal limits, intervals within normal limits, RSR prime V1 and V2, no  acute ST-T wave changes.   Recent Labs: 12/02/2015: ALT 29; BUN 12; Creatinine, Ser 1.03; Hemoglobin 16.9; Platelets 202.0; Potassium 5.0; Sodium 140; TSH 1.14    Lipid Panel    Component Value Date/Time   CHOL 105 09/19/2015 0831   CHOL 96 11/01/2012 0826   TRIG 126 09/19/2015 0831   TRIG 118 11/01/2012 0826   HDL 41 09/19/2015 0831   HDL 34 (L) 11/01/2012 0826   LDLCALC 38 11/01/2012 0826      Wt Readings from Last 3 Encounters:  12/10/15 152 lb (68.9 kg)  12/02/15 153 lb (69.4 kg)  11/06/15 153 lb (69.4 kg)      Other studies Reviewed: Additional studies/ records that were reviewed today include: POET (Plain Old Exercise Treadmill). Review of the above records demonstrates:  Please see elsewhere in the note.     ASSESSMENT AND PLAN:  ABNORMAL STRESS TEST:   I have been trying to get the full tracings of the POET (Plain Old Exercise Treadmill).  When available I will review this.  He has no symptoms and no significant risk factors and went 9 minutes on the stress test and apparently could have gone further.  Not clinically high risk.  Further testing will be based on my review of this study.     Current medicines are reviewed at length with the patient today.  The patient does not have concerns regarding medicines.  The following changes have been made:  no change  Labs/ tests ordered today include:  No orders of the defined types were placed in this encounter.    Disposition:   FU with me as needed.     Signed, Minus Breeding, MD  12/10/2015 3:59 PM    [facility]

## 2015-12-10 ENCOUNTER — Ambulatory Visit: Payer: 59 | Admitting: Cardiology

## 2015-12-10 ENCOUNTER — Ambulatory Visit (INDEPENDENT_AMBULATORY_CARE_PROVIDER_SITE_OTHER): Payer: PRIVATE HEALTH INSURANCE | Admitting: Cardiology

## 2015-12-10 ENCOUNTER — Encounter: Payer: Self-pay | Admitting: Cardiology

## 2015-12-10 VITALS — BP 124/72 | HR 65 | Ht 66.0 in | Wt 152.0 lb

## 2015-12-10 DIAGNOSIS — R9439 Abnormal result of other cardiovascular function study: Secondary | ICD-10-CM | POA: Diagnosis not present

## 2015-12-10 NOTE — Patient Instructions (Signed)
Your physician recommends that you schedule a follow-up appointment in: As Needed    

## 2015-12-16 ENCOUNTER — Telehealth: Payer: Self-pay | Admitting: *Deleted

## 2015-12-16 NOTE — Telephone Encounter (Signed)
-----   Message from Jerene Bears, MD sent at 12/16/2015  2:03 PM EDT ----- Regarding: FW: Colonoscopy with + stress test See note from cards Need to reschedule pending cardiology final opinion Thanks JMP  ----- Message ----- From: Minus Breeding, MD Sent: 12/16/2015  12:39 PM To: Jerene Bears, MD, Vennie Homans Subject: RE: Colonoscopy with + stress test             I would hold off.  I cannot seem to get the actual strips to look at but we are trying.    ----- Message ----- From: Jerene Bears, MD Sent: 12/16/2015   8:37 AM To: Minus Breeding, MD, Chipper Herb, MD Subject: Colonoscopy with + stress test                 Maylon Cos I saw Mr. Krampitz recently and he has upcoming colonoscopy (tomorrow).  I was reviewing and saw his recently positive stress test.  Do you think it is appropriate to proceed with colonoscopy tomorrow or wait until further cardiac eval/testing?  Thanks for your help Ulice Dash

## 2015-12-16 NOTE — Telephone Encounter (Signed)
I have spoken to patient to discuss delaying endo/colon for now due to recent abnormal stress test as discussed by Dr Percival Spanish and Dr Hilarie Fredrickson. Patient verbalizes understanding. Currently, he is on schedule for 01/29/16 @ 3 pm. I advised that I will contact him a couple of weeks prior to this to go over cardiac clearance and updated prep at that time. Patient agrees with this plan.

## 2015-12-17 ENCOUNTER — Encounter: Payer: 59 | Admitting: Internal Medicine

## 2015-12-28 ENCOUNTER — Telehealth: Payer: Self-pay | Admitting: *Deleted

## 2015-12-28 NOTE — Telephone Encounter (Signed)
Leave message for pt to call back  Message send to Dr Hilarie Fredrickson via [employer]

## 2015-12-28 NOTE — Telephone Encounter (Signed)
-----   Message from Minus Breeding, MD sent at 12/26/2015  7:46 PM EDT ----- Please call the patient and document in the chart as a phone note.  I have reviewed the actual treadmill tracings from 11/26/15.  I do not think that this is a positive result.  The ST depression was less than 1 mm and occurred only at greater than 8 minutes.  This does not meet criteria for a positive result.  He has excellent exercise tolerance and has no symptoms.  No further testing is indicated.  Please let Dr. Hilarie Fredrickson know that the patient is OK for a GI procedure.    Please forward a copy of the phone note to me.

## 2015-12-31 NOTE — Telephone Encounter (Signed)
Spoke with pt wife Caryl Asp about her husband stress test and clearance for surgery.[name] Kitchen

## 2015-12-31 NOTE — Telephone Encounter (Signed)
Mrs. Gary is returning a call regarding her husband's test results---please call her cell [phone]

## 2016-01-04 NOTE — Telephone Encounter (Signed)
Patient is already scheduled for 01/29/16 and I will contact him prior to procedure for instruction.

## 2016-01-04 NOTE — Telephone Encounter (Signed)
Ok to schedule procedure as before after negative stress testing

## 2016-01-29 ENCOUNTER — Encounter: Payer: Self-pay | Admitting: Internal Medicine

## 2016-01-29 ENCOUNTER — Ambulatory Visit (AMBULATORY_SURGERY_CENTER): Payer: PRIVATE HEALTH INSURANCE | Admitting: Internal Medicine

## 2016-01-29 VITALS — BP 99/52 | HR 58 | Temp 98.0°F | Resp 23 | Ht 64.0 in | Wt 153.0 lb

## 2016-01-29 DIAGNOSIS — R140 Abdominal distension (gaseous): Secondary | ICD-10-CM | POA: Diagnosis not present

## 2016-01-29 DIAGNOSIS — K319 Disease of stomach and duodenum, unspecified: Secondary | ICD-10-CM | POA: Diagnosis not present

## 2016-01-29 DIAGNOSIS — K2930 Chronic superficial gastritis without bleeding: Secondary | ICD-10-CM | POA: Diagnosis not present

## 2016-01-29 DIAGNOSIS — D125 Benign neoplasm of sigmoid colon: Secondary | ICD-10-CM | POA: Diagnosis not present

## 2016-01-29 DIAGNOSIS — Z800 Family history of malignant neoplasm of digestive organs: Secondary | ICD-10-CM

## 2016-01-29 DIAGNOSIS — K228 Other specified diseases of esophagus: Secondary | ICD-10-CM | POA: Diagnosis not present

## 2016-01-29 MED ORDER — SODIUM CHLORIDE 0.9 % IV SOLN: 500.0000 mL | INTRAVENOUS | Status: DC

## 2016-01-29 NOTE — Op Note (Signed)
[facility] Patient Name: Jorge [name] Procedure Date: 01/29/2016 2:10 PM MRN: [id number] Endoscopist: Jerene Bears , MD Age: 55 Referring MD:  Date of Birth: 05/11/1960 Gender: Male Account #: [id number] Procedure:                Colonoscopy Indications:              Screening in patient at increased risk: Family                            history of 1st-degree relative with colorectal                            cancer, Last colonoscopy: January 2011 Medicines:                Monitored Anesthesia Care Procedure:                Pre-Anesthesia Assessment:                           - Prior to the procedure, a History and Physical                            was performed, and patient medications and                            allergies were reviewed. The patient's tolerance of                            previous anesthesia was also reviewed. The risks                            and benefits of the procedure and the sedation                            options and risks were discussed with the patient.                            All questions were answered, and informed consent                            was obtained. Prior Anticoagulants: The patient has                            taken no previous anticoagulant or antiplatelet                            agents. ASA Grade Assessment: II - A patient with                            mild systemic disease. After reviewing the risks                            and benefits, the patient was deemed in  satisfactory condition to undergo the procedure.                           - Prior to the procedure, a History and Physical                            was performed, and patient medications and                            allergies were reviewed. The patient's tolerance of                            previous anesthesia was also reviewed. The risks                            and benefits of the procedure and the  sedation                            options and risks were discussed with the patient.                            All questions were answered, and informed consent                            was obtained. Prior Anticoagulants: The patient has                            taken no previous anticoagulant or antiplatelet                            agents. ASA Grade Assessment: II - A patient with                            mild systemic disease. After reviewing the risks                            and benefits, the patient was deemed in                            satisfactory condition to undergo the procedure.                           After obtaining informed consent, the colonoscope                            was passed under direct vision. Throughout the                            procedure, the patient's blood pressure, pulse, and                            oxygen saturations were monitored continuously. The  Model CF-HQ190L [id number]) scope was introduced                            through the anus and advanced to the the cecum,                            identified by appendiceal orifice and ileocecal                            valve. The colonoscopy was performed without                            difficulty. The patient tolerated the procedure                            well. The quality of the bowel preparation was                            good. The ileocecal valve, appendiceal orifice, and                            rectum were photographed. Scope In: 2:47:41 PM Scope Out: 2:57:57 PM Scope Withdrawal Time: 0 hours 9 minutes 1 second  Total Procedure Duration: 0 hours 10 minutes 16 seconds  Findings:                 The digital rectal exam was normal.                           A 4 mm polyp was found in the sigmoid colon. The                            polyp was sessile. The polyp was removed with a                            cold snare. Resection and  retrieval were complete.                           External hemorrhoids were found during                            retroflexion. The hemorrhoids were small.                           The exam was otherwise without abnormality. Complications:            No immediate complications. Estimated Blood Loss:     Estimated blood loss was minimal. Impression:               - One 4 mm polyp in the sigmoid colon, removed with                            a cold snare. Resected and retrieved.                           -  External hemorrhoids.                           - The examination was otherwise normal. Recommendation:           - Patient has a contact number available for                            emergencies. The signs and symptoms of potential                            delayed complications were discussed with the                            patient. Return to normal activities tomorrow.                            Written discharge instructions were provided to the                            patient.                           - Resume previous diet.                           - Continue present medications.                           - Await pathology results.                           - Repeat colonoscopy in 5 years. Jerene Bears, MD 01/29/2016 3:11:15 PM This report has been signed electronically.

## 2016-01-29 NOTE — Progress Notes (Signed)
Called to room to assist during endoscopic procedure.  Patient ID and intended procedure confirmed with present staff. Received instructions for my participation in the procedure from the performing physician.  

## 2016-01-29 NOTE — Op Note (Signed)
[facility] Patient Name: Jorge [name] Procedure Date: 01/29/2016 2:10 PM MRN: [id number] Endoscopist: Jerene Bears , MD Age: 55 Referring MD:  Date of Birth: November 02, 1960 Gender: Male Account #: [id number] Procedure:                Upper GI endoscopy Indications:              Abdominal bloating, Early satiety Medicines:                Monitored Anesthesia Care Procedure:                Pre-Anesthesia Assessment:                           - Prior to the procedure, a History and Physical                            was performed, and patient medications and                            allergies were reviewed. The patient's tolerance of                            previous anesthesia was also reviewed. The risks                            and benefits of the procedure and the sedation                            options and risks were discussed with the patient.                            All questions were answered, and informed consent                            was obtained. Prior Anticoagulants: The patient has                            taken no previous anticoagulant or antiplatelet                            agents. ASA Grade Assessment: II - A patient with                            mild systemic disease. After reviewing the risks                            and benefits, the patient was deemed in                            satisfactory condition to undergo the procedure.                           After obtaining informed consent, the endoscope was  passed under direct vision. Throughout the                            procedure, the patient's blood pressure, pulse, and                            oxygen saturations were monitored continuously. The                            Model GIF-[id number] scope was introduced                            through the mouth, and advanced to the second part                            of duodenum. The upper  GI endoscopy was                            accomplished without difficulty. The patient                            tolerated the procedure well. Scope In: Scope Out: Findings:                 Mucosal changes including longitudinal furrows and                            white plaques were found in the middle third of the                            esophagus and in the lower third of the esophagus.                            Biopsies were obtained from the proximal and distal                            esophagus with cold forceps for histology of                            suspected eosinophilic esophagitis.                           A 2 cm hiatal hernia was present.                           Patchy minimal inflammation characterized by                            adherent blood and erythema was found at the                            incisura and in the gastric antrum. Biopsies were  taken with a cold forceps for histology and                            Helicobacter pylori testing.                           The cardia and gastric fundus were normal on                            retroflexion.                           Localized moderate inflammation characterized by                            erythema and granularity was found in the duodenal                            bulb.                           The second portion of the duodenum was normal. Complications:            No immediate complications. Estimated Blood Loss:     Estimated blood loss was minimal. Impression:               - Esophageal mucosal changes suspicious for                            eosinophilic esophagitis. Biopsied.                           - 2 cm hiatal hernia.                           - Gastritis. Biopsied.                           - Duodenitis.                           - Normal second portion of the duodenum. Recommendation:           - Patient has a contact number available for                             emergencies. The signs and symptoms of potential                            delayed complications were discussed with the                            patient. Return to normal activities tomorrow.                            Written discharge instructions were provided to the  patient.                           - Resume previous diet.                           - Continue present medications.                           - Avoid ibuprofen, naproxen, or other non-steroidal                            anti-inflammatory drugs.                           - Await pathology results. Jerene Bears, MD 01/29/2016 3:06:27 PM This report has been signed electronically.

## 2016-01-29 NOTE — Patient Instructions (Signed)
YOU HAD AN ENDOSCOPIC PROCEDURE TODAY AT [facility]:   Refer to the procedure report that was given to you for any specific questions about what was found during the examination.  If the procedure report does not answer your questions, please call your gastroenterologist to clarify.  If you requested that your care partner not be given the details of your procedure findings, then the procedure report has been included in a sealed envelope for you to review at your convenience later.  YOU SHOULD EXPECT: Some feelings of bloating in the abdomen. Passage of more gas than usual.  Walking can help get rid of the air that was put into your GI tract during the procedure and reduce the bloating. If you had a lower endoscopy (such as a colonoscopy or flexible sigmoidoscopy) you may notice spotting of blood in your stool or on the toilet paper. If you underwent a bowel prep for your procedure, you may not have a normal bowel movement for a few days.  Please Note:  You might notice some irritation and congestion in your nose or some drainage.  This is from the oxygen used during your procedure.  There is no need for concern and it should clear up in a day or so.  SYMPTOMS TO REPORT IMMEDIATELY:   Following lower endoscopy (colonoscopy or flexible sigmoidoscopy):  Excessive amounts of blood in the stool  Significant tenderness or worsening of abdominal pains  Swelling of the abdomen that is new, acute  Fever of 100F or higher   Following upper endoscopy (EGD)  Vomiting of blood or coffee ground material  New chest pain or pain under the shoulder blades  Painful or persistently difficult swallowing  New shortness of breath  Fever of 100F or higher  Black, tarry-looking stools  For urgent or emergent issues, a gastroenterologist can be reached at any hour by calling [phone].   DIET:  We do recommend a small meal at first, but then you may proceed to your regular diet.  Drink  plenty of fluids but you should avoid alcoholic beverages for 24 hours.  ACTIVITY:  You should plan to take it easy for the rest of today and you should NOT DRIVE or use heavy machinery until tomorrow (because of the sedation medicines used during the test).    FOLLOW UP: Our staff will call the number listed on your records the next business day following your procedure to check on you and address any questions or concerns that you may have regarding the information given to you following your procedure. If we do not reach you, we will leave a message.  However, if you are feeling well and you are not experiencing any problems, there is no need to return our call.  We will assume that you have returned to your regular daily activities without incident.  If any biopsies were taken you will be contacted by phone or by letter within the next 1-3 weeks.  Please call us at [phone] if you have not heard about the biopsies in 3 weeks.    SIGNATURES/CONFIDENTIALITY: You and/or your care partner have signed paperwork which will be entered into your electronic medical record.  These signatures attest to the fact that that the information above on your After Visit Summary has been reviewed and is understood.  Full responsibility of the confidentiality of this discharge information lies with you and/or your care-partner.  Polyp, hemorrhoid information given.  Avoid ibuprofe, naproxen, or other non-steroidal  anti-inflammatory meds.  Hiatal hernia, esogagitis, and gastritis information given.

## 2016-02-01 ENCOUNTER — Telehealth: Payer: Self-pay | Admitting: *Deleted

## 2016-02-01 NOTE — Telephone Encounter (Signed)
  Follow up Call-  Call back number 01/29/2016  Post procedure Call Back phone  # [phone]  Permission to leave phone message Yes  Some recent data might be hidden     Patient questions:  Do you have a fever, pain , or abdominal swelling? No. Pain Score  0 *  Have you tolerated food without any problems? Yes.    Have you been able to return to your normal activities? Yes.    Do you have any questions about your discharge instructions: Diet   No. Medications  No. Follow up visit  No.  Do you have questions or concerns about your Care? No.  Actions: * If pain score is 4 or above: No action needed, pain <4.

## 2016-02-10 ENCOUNTER — Encounter: Payer: Self-pay | Admitting: Internal Medicine

## 2016-02-17 ENCOUNTER — Other Ambulatory Visit: Payer: Self-pay

## 2016-02-17 ENCOUNTER — Telehealth: Payer: Self-pay | Admitting: Internal Medicine

## 2016-02-17 MED ORDER — PANTOPRAZOLE SODIUM 40 MG PO TBEC: 40.0000 mg | DELAYED_RELEASE_TABLET | Freq: Every day | ORAL | 0 refills | Status: DC

## 2016-02-17 NOTE — Telephone Encounter (Signed)
Path results discussed over the phone with pts wife, script for protonix sent to the pharmacy.

## 2016-03-02 ENCOUNTER — Ambulatory Visit (INDEPENDENT_AMBULATORY_CARE_PROVIDER_SITE_OTHER): Payer: PRIVATE HEALTH INSURANCE

## 2016-03-02 DIAGNOSIS — Z23 Encounter for immunization: Secondary | ICD-10-CM

## 2016-03-06 ENCOUNTER — Other Ambulatory Visit: Payer: Self-pay | Admitting: Family Medicine

## 2016-03-18 ENCOUNTER — Ambulatory Visit: Payer: PRIVATE HEALTH INSURANCE | Admitting: Family Medicine

## 2016-03-25 ENCOUNTER — Encounter: Payer: Self-pay | Admitting: *Deleted

## 2016-04-19 ENCOUNTER — Other Ambulatory Visit: Payer: Self-pay | Admitting: Internal Medicine

## 2016-05-02 ENCOUNTER — Ambulatory Visit: Payer: PRIVATE HEALTH INSURANCE | Admitting: Internal Medicine

## 2016-05-09 ENCOUNTER — Other Ambulatory Visit: Payer: Self-pay | Admitting: Family Medicine

## 2016-06-12 ENCOUNTER — Other Ambulatory Visit: Payer: Self-pay | Admitting: Family Medicine

## 2016-10-12 ENCOUNTER — Other Ambulatory Visit: Payer: Self-pay | Admitting: *Deleted

## 2016-10-12 MED ORDER — ROSUVASTATIN CALCIUM 40 MG PO TABS: 40.0000 mg | ORAL_TABLET | Freq: Every day | ORAL | 1 refills | Status: DC

## 2016-11-09 ENCOUNTER — Telehealth: Payer: Self-pay | Admitting: *Deleted

## 2016-11-09 MED ORDER — ATENOLOL 25 MG PO TABS: 25.0000 mg | ORAL_TABLET | Freq: Every day | ORAL | Status: DC

## 2016-11-09 NOTE — Telephone Encounter (Signed)
Start atenolol 25 mg 1 daily and have patient come by for blood pressure check a couple weeks after starting this to see what blood pressure readings are at that time

## 2016-11-09 NOTE — Telephone Encounter (Signed)
Wife aware of change and meds sent in

## 2017-02-13 ENCOUNTER — Other Ambulatory Visit: Payer: Self-pay | Admitting: Family Medicine

## 2017-02-14 ENCOUNTER — Ambulatory Visit (INDEPENDENT_AMBULATORY_CARE_PROVIDER_SITE_OTHER): Payer: Self-pay

## 2017-02-14 DIAGNOSIS — Z23 Encounter for immunization: Secondary | ICD-10-CM

## 2017-02-14 NOTE — Telephone Encounter (Signed)
RFd for 2 months, Roselyn Reef to call pt to make appt, they have been in between insurance

## 2017-04-12 ENCOUNTER — Ambulatory Visit (INDEPENDENT_AMBULATORY_CARE_PROVIDER_SITE_OTHER): Payer: Self-pay | Admitting: Family Medicine

## 2017-04-12 ENCOUNTER — Encounter: Payer: Self-pay | Admitting: Family Medicine

## 2017-04-12 VITALS — BP 136/72 | HR 73 | Temp 99.2°F | Ht 64.0 in | Wt 163.0 lb

## 2017-04-12 DIAGNOSIS — J0100 Acute maxillary sinusitis, unspecified: Secondary | ICD-10-CM

## 2017-04-12 MED ORDER — PSEUDOEPHEDRINE-GUAIFENESIN ER 60-600 MG PO TB12: 1.0000 | ORAL_TABLET | Freq: Two times a day (BID) | ORAL | 0 refills | Status: AC

## 2017-04-12 MED ORDER — SULFAMETHOXAZOLE-TRIMETHOPRIM 800-160 MG PO TABS: 1.0000 | ORAL_TABLET | Freq: Two times a day (BID) | ORAL | 0 refills | Status: DC

## 2017-04-12 MED ORDER — ATENOLOL 25 MG PO TABS: 25.0000 mg | ORAL_TABLET | Freq: Every day | ORAL | 2 refills | Status: DC

## 2017-04-12 NOTE — Progress Notes (Signed)
Chief Complaint  Patient presents with  . Sinusitis    nasal congestion and maxillary facial pressure and mild headache x 1 week. Has taken Mucinex.     HPI  Patient presents today for Patient presents with upper respiratory congestion. Rhinorrhea that is frequently purulent. There is moderate sore throat. Patient reports coughing frequently as well. green sputum noted. There is no fever, chills or sweats. The patient denies being short of breath. Onset was 3-5 days ago. Gradually worsening. Tried OTCs without improvement.  PMH: Smoking status noted ROS: Per HPI  Objective: BP 136/72   Pulse 73   Temp 99.2 F (37.3 C) (Oral)   Ht 5\' 4"  (1.626 m)   Wt 163 lb (73.9 kg)   BMI 27.98 kg/m  Gen: NAD, alert, cooperative with exam HEENT: NCAT, Nasal passages swollen, red TMS RED CV: RRR, good S1/S2, no murmur Resp: Bronchitis changes with scattered wheezes, non-labored Ext: No edema, warm Neuro: Alert and oriented, No gross deficits  Assessment and plan:  1. Acute maxillary sinusitis, recurrence not specified     Meds ordered this encounter  Medications  . atenolol (TENORMIN) 25 MG tablet    Sig: Take 1 tablet (25 mg total) by mouth daily.    Dispense:  30 tablet    Refill:  2  . sulfamethoxazole-trimethoprim (BACTRIM DS,SEPTRA DS) 800-160 MG tablet    Sig: Take 1 tablet by mouth 2 (two) times daily. Until gone, for infection    Dispense:  20 tablet    Refill:  0  . pseudoephedrine-guaifenesin (MUCINEX D) 60-600 MG 12 hr tablet    Sig: Take 1 tablet by mouth every 12 (twelve) hours for 14 days. As needed for congestion    Dispense:  20 tablet    Refill:  0    No orders of the defined types were placed in this encounter.   Follow up as needed.  Claretta Fraise, MD

## 2017-08-08 ENCOUNTER — Telehealth: Payer: Self-pay | Admitting: Family Medicine

## 2017-08-08 MED ORDER — ATENOLOL 25 MG PO TABS: 25.0000 mg | ORAL_TABLET | Freq: Every day | ORAL | 2 refills | Status: DC

## 2017-08-08 NOTE — Telephone Encounter (Signed)
appt made for pt to see [facility] and atenolol refilled at [facility]

## 2017-08-23 ENCOUNTER — Encounter: Payer: Self-pay | Admitting: Family Medicine

## 2017-08-23 ENCOUNTER — Ambulatory Visit (INDEPENDENT_AMBULATORY_CARE_PROVIDER_SITE_OTHER): Payer: Self-pay | Admitting: Family Medicine

## 2017-08-23 VITALS — BP 129/74 | HR 65 | Temp 97.7°F | Ht 64.0 in | Wt 161.0 lb

## 2017-08-23 DIAGNOSIS — N5201 Erectile dysfunction due to arterial insufficiency: Secondary | ICD-10-CM

## 2017-08-23 DIAGNOSIS — Z800 Family history of malignant neoplasm of digestive organs: Secondary | ICD-10-CM

## 2017-08-23 DIAGNOSIS — E7800 Pure hypercholesterolemia, unspecified: Secondary | ICD-10-CM

## 2017-08-23 DIAGNOSIS — Z760 Encounter for issue of repeat prescription: Secondary | ICD-10-CM

## 2017-08-23 DIAGNOSIS — I10 Essential (primary) hypertension: Secondary | ICD-10-CM

## 2017-08-23 DIAGNOSIS — E349 Endocrine disorder, unspecified: Secondary | ICD-10-CM

## 2017-08-23 MED ORDER — ATENOLOL 25 MG PO TABS: 25.0000 mg | ORAL_TABLET | Freq: Every day | ORAL | 5 refills | Status: DC

## 2017-08-23 MED ORDER — TESTOSTERONE 20.25 MG/ACT (1.62%) TD GEL: 2.0000 "application " | Freq: Every day | TRANSDERMAL | 3 refills | Status: DC

## 2017-08-23 MED ORDER — ROSUVASTATIN CALCIUM 40 MG PO TABS: ORAL_TABLET | ORAL | 5 refills | Status: DC

## 2017-08-23 NOTE — Patient Instructions (Addendum)
Continue current medications. Continue good therapeutic lifestyle changes which include good diet and exercise. Fall precautions discussed with patient. If an FOBT was given today- please return it to our front desk. If you are over 56 years old - you may need Prevnar 61 or the adult Pneumonia vaccine.  **Flu shots are available--- please call and schedule a FLU-CLINIC appointment**  After your visit with [place] today you will receive a survey in the mail or online from [employer] regarding your care with [place]. Please take a moment to fill this out. Your feedback is very important to [place] as you can help [place] better understand your patient needs as well as improve your experience and satisfaction. WE CARE ABOUT YOU!!!   Continue current treatment and return to the office in 3 to 4 months for follow-up and hopefully will be able to do any lab work at that time.

## 2017-08-23 NOTE — Progress Notes (Signed)
Subjective:    Patient ID: Jorge [name], male    DOB: [birth year], 57 y.o.   MRN: [id number]  HPI Pt here for follow up and management of chronic medical problems which includes hypertension. He is taking medications regularly.  The patient is doing well overall.  Currently he does not have any insurance and we need to reduce the cost of his care to as low as possible.  We will plan to refill his medicines today see him back in about 3 months and do a physical exam on him at that time.  He will also get lab work at that time.  The patient has hypertension hyperlipidemia and testosterone deficiency.  Has no complaints of chest pain pressure tightness shortness of breath trouble swallowing heartburn indigestion nausea vomiting diarrhea blood in the stool or black tarry bowel movements.  He is passing his water without problems.  He has been out of his testosterone replacement for about a month and he has felt more fatigued and tired and has had more erectile dysfunction.  He is still working but his insurance coverage is not there currently.  His wife has also lost her insurance coverage.  We have agreed to go ahead and refill his medicines and have him come back in 3 to 4 months and do lab work at that time and hopefully he will have insurance coverage to carry him further with his lab work.    Patient Active Problem List   Diagnosis Date Noted  . Nephrolithiasis 02/13/2014  . Metabolic syndrome 43/32/9518  . Hyperlipidemia 10/31/2012  . Hypertension 10/31/2012  . Testosterone deficiency 10/31/2012  . Vitamin D deficiency 10/31/2012   Outpatient Encounter Medications as of 08/23/2017  Medication Sig  . atenolol (TENORMIN) 25 MG tablet Take 1 tablet (25 mg total) by mouth daily.  . cholecalciferol (VITAMIN D) 1000 UNITS tablet Take 2,000 Units by mouth daily.    . fish oil-omega-3 fatty acids 1000 MG capsule Take 1,200 mg by mouth daily.   [name] Kitchen omeprazole (PRILOSEC OTC) 20 MG tablet Take 20 mg by  mouth daily.  . rosuvastatin (CRESTOR) 40 MG tablet TAKE ONE (1) TABLET EACH DAY  . Testosterone (ANDROGEL PUMP) 20.25 MG/ACT (1.62%) GEL Place 2 application onto the skin daily.  . [DISCONTINUED] HYDROcodone-acetaminophen (NORCO) 10-325 MG per tablet Take 1 tablet by mouth every 8 (eight) hours as needed.  . [DISCONTINUED] ibuprofen (ADVIL,MOTRIN) 200 MG tablet Take 400 mg by mouth every 6 (six) hours as needed. For pain   . [DISCONTINUED] Magnesium 500 MG TABS Take by mouth.  . [DISCONTINUED] pantoprazole (PROTONIX) 40 MG tablet TAKE ONE (1) TABLET EACH DAY  . [DISCONTINUED] sulfamethoxazole-trimethoprim (BACTRIM DS,SEPTRA DS) 800-160 MG tablet Take 1 tablet by mouth 2 (two) times daily. Until gone, for infection  . [DISCONTINUED] 0.9 %  sodium chloride infusion    No facility-administered encounter medications on file as of 08/23/2017.       Review of Systems  Constitutional: Negative.   HENT: Negative.   Eyes: Negative.   Respiratory: Negative.   Cardiovascular: Negative.   Gastrointestinal: Negative.   Endocrine: Negative.   Genitourinary: Negative.   Musculoskeletal: Negative.   Skin: Negative.   Allergic/Immunologic: Negative.   Neurological: Negative.   Hematological: Negative.   Psychiatric/Behavioral: Negative.        Objective:   Physical Exam  Constitutional: He is oriented to person, place, and time. He appears well-developed and well-nourished. No distress.  Patient is calm pleasant and relaxed.  This is despite all the stress he is experiencing at work and having no insurance through himself or his wife.  HENT:  Head: Normocephalic and atraumatic.  Right Ear: External ear normal.  Left Ear: External ear normal.  Mouth/Throat: Oropharynx is clear and moist. No oropharyngeal exudate.  Nasal turbinate congestion bilaterally  Eyes: Pupils are equal, round, and reactive to light. Conjunctivae and EOM are normal. Right eye exhibits no discharge. Left eye exhibits no  discharge.  Neck: Normal range of motion. Neck supple. No thyromegaly present.  No thyromegaly or anterior cervical adenopathy or bruits  Cardiovascular: Normal rate, regular rhythm, normal heart sounds and intact distal pulses.  No murmur heard. Heart is regular at 72/min  Pulmonary/Chest: Effort normal and breath sounds normal. No respiratory distress. He has no wheezes. He has no rales. He exhibits no tenderness.  Clear anteriorly and posteriorly  Abdominal: Soft. Bowel sounds are normal. He exhibits no mass. There is no tenderness. There is no rebound and no guarding.  No abdominal tenderness masses bruits organ enlargement or inguinal adenopathy  Musculoskeletal: Normal range of motion. He exhibits no edema.  Lymphadenopathy:    He has no cervical adenopathy.  Neurological: He is alert and oriented to person, place, and time. He has normal reflexes. No cranial nerve deficit.  Skin: Skin is warm and dry. No rash noted.  Psychiatric: He has a normal mood and affect. His behavior is normal. Judgment and thought content normal.  Patient has normal affect mood behavior and judgment.  Nursing note and vitals reviewed.  BP 129/74 (BP Location: Left Arm)   Pulse 65   Temp 97.7 F (36.5 C) (Oral)   Ht 5\' 4"  (1.626 m)   Wt 161 lb (73 kg)   BMI 27.64 kg/m         Assessment & Plan:  1. Medication refill -Blood pressure and vital signs are stable today.  Weight is down a couple pounds.  2. Essential hypertension -Continue current treatment  3. Pure hypercholesterolemia -Continue current treatment and as aggressive therapeutic lifestyle changes as possible  4. Family history of colon cancer -Last colonoscopy was October 2017.  The patient does have a family history of colon cancer and this would need to be repeated in 2022.  5. Testosterone deficiency -Continue with testosterone replacement and check prostate in 3 to 4 months and testosterone level  6. Erectile dysfunction due  to arterial insufficiency -Continue with testosterone replacement  No orders of the defined types were placed in this encounter.  Patient Instructions  Continue current medications. Continue good therapeutic lifestyle changes which include good diet and exercise. Fall precautions discussed with patient. If an FOBT was given today- please return it to our front desk. If you are over 43 years old - you may need Prevnar 71 or the adult Pneumonia vaccine.  **Flu shots are available--- please call and schedule a FLU-CLINIC appointment**  After your visit with [place] today you will receive a survey in the mail or online from [employer] regarding your care with [place]. Please take a moment to fill this out. Your feedback is very important to [place] as you can help [place] better understand your patient needs as well as improve your experience and satisfaction. WE CARE ABOUT YOU!!!   Continue current treatment and return to the office in 3 to 4 months for follow-up and hopefully will be able to do any lab work at that time.  Arrie Senate MD

## 2017-08-24 ENCOUNTER — Telehealth: Payer: Self-pay | Admitting: Family Medicine

## 2017-08-25 NOTE — Telephone Encounter (Signed)
Spoke with wife - she will talk with husband and pharm and see what may work for the right price and let us know

## 2017-09-18 ENCOUNTER — Telehealth: Payer: Self-pay | Admitting: Family Medicine

## 2017-09-18 NOTE — Telephone Encounter (Signed)
Please advise on using a triage appointment.

## 2017-09-18 NOTE — Telephone Encounter (Signed)
appt scheduled Pt notified 

## 2017-09-19 ENCOUNTER — Ambulatory Visit (INDEPENDENT_AMBULATORY_CARE_PROVIDER_SITE_OTHER): Payer: Self-pay | Admitting: Family Medicine

## 2017-09-19 ENCOUNTER — Ambulatory Visit (INDEPENDENT_AMBULATORY_CARE_PROVIDER_SITE_OTHER): Payer: Self-pay

## 2017-09-19 ENCOUNTER — Encounter: Payer: Self-pay | Admitting: Family Medicine

## 2017-09-19 VITALS — BP 154/77 | HR 65 | Temp 98.4°F | Ht 64.0 in | Wt 162.0 lb

## 2017-09-19 DIAGNOSIS — I10 Essential (primary) hypertension: Secondary | ICD-10-CM

## 2017-09-19 DIAGNOSIS — M25532 Pain in left wrist: Secondary | ICD-10-CM

## 2017-09-19 NOTE — Progress Notes (Signed)
Subjective:    Patient ID: Jorge [name], male    DOB: 09/13/1960, 57 y.o.   MRN: [id number]  HPI Pt here for left hand and wrist pain.  Konor is here today for a left hand and wrist pain which started about 2 weeks ago when he had no injury.  There was a knot but this is gone away.  Patient is taking Tenormin 25 mg omeprazole and Crestor.  He is also on AndroGel 1.62%.   Patient Active Problem List   Diagnosis Date Noted  . Nephrolithiasis 02/13/2014  . Metabolic syndrome 23/55/7322  . Hyperlipidemia 10/31/2012  . Hypertension 10/31/2012  . Testosterone deficiency 10/31/2012  . Vitamin D deficiency 10/31/2012   Outpatient Encounter Medications as of 09/19/2017  Medication Sig  . atenolol (TENORMIN) 25 MG tablet Take 1 tablet (25 mg total) by mouth daily.  . cholecalciferol (VITAMIN D) 1000 UNITS tablet Take 2,000 Units by mouth daily.    . fish oil-omega-3 fatty acids 1000 MG capsule Take 1,200 mg by mouth daily.   [name] Kitchen omeprazole (PRILOSEC OTC) 20 MG tablet Take 20 mg by mouth daily.  . rosuvastatin (CRESTOR) 40 MG tablet TAKE ONE (1) TABLET EACH DAY  . UNABLE TO FIND Med Name: DHEA 100 mg once a day  (for OTC testosterone)  . Testosterone (ANDROGEL PUMP) 20.25 MG/ACT (1.62%) GEL Place 2 application onto the skin daily. (Patient not taking: Reported on 09/19/2017)   No facility-administered encounter medications on file as of 09/19/2017.       Review of Systems  Constitutional: Negative.   HENT: Negative.   Eyes: Negative.   Respiratory: Negative.   Cardiovascular: Negative.   Gastrointestinal: Negative.   Endocrine: Negative.   Genitourinary: Negative.   Musculoskeletal: Positive for arthralgias (left wrist pain ).  Skin: Negative.   Allergic/Immunologic: Negative.   Neurological: Negative.   Hematological: Negative.   Psychiatric/Behavioral: Negative.        Objective:   Physical Exam  Constitutional: He is oriented to person, place, and time. He appears  well-developed and well-nourished. No distress.  Pleasant and alert  HENT:  Head: Normocephalic and atraumatic.  Eyes: Pupils are equal, round, and reactive to light. Conjunctivae and EOM are normal. Right eye exhibits no discharge. Left eye exhibits no discharge.  Neck: Normal range of motion.  Cardiovascular: Normal rate, regular rhythm and normal heart sounds.  No murmur heard. Pulmonary/Chest: Effort normal. He has no wheezes. He has no rales.  Clear anteriorly and posteriorly  Abdominal: Soft. He exhibits no mass. There is no tenderness. There is no guarding.  Musculoskeletal: He exhibits edema and tenderness.  Left wrist is more swollen than right wrist.  Left dorsal wrist is tender to palpation.  Bilateral wrist pressure on the left side does not hurt as much as dorsal wrist pressure.  There is decreased ability to flex the wrist due to pain and swelling  Neurological: He is alert and oriented to person, place, and time.  Skin: Skin is warm and dry. No rash noted.  Psychiatric: He has a normal mood and affect. His behavior is normal. Judgment and thought content normal.  Nursing note and vitals reviewed.  X-ray of left wrist is pending=== Uric acid result is pending===       Assessment & Plan:  1. Left wrist pain -Take Tylenol for pain, no more than 2 extra strength Tylenol 3 times daily. -Use warm wet compresses 20 minutes 4 times daily -Wear a wrist brace for support especially  at work and do not do any lifting with the left wrist. - Uric acid - DG Wrist Complete Left; Future  2. Essential hypertension -Discontinue ibuprofen -Check blood pressure readings at home and call back with results in a couple of weeks  Patient Instructions  Use warm wet compresses 20 minutes 4 times daily Discontinue ibuprofen and take extra strength Tylenol 1 4 times daily if needed for severe pain Continue wearing the wrist brace and avoid doing heavy lifting with the left hand as much as  possible We will call with the x-ray results tomorrow and the uric acid result tomorrow and if all of this is normal we will try a course of prednisone taper and continue with the extra strength Tylenol.  We will not do any prednisone though until the x-ray results are returned and the uric acid is returned. Check blood pressure readings at home and call back with results in a couple of weeks and watch sodium intake  Arrie Senate MD

## 2017-09-19 NOTE — Patient Instructions (Addendum)
Use warm wet compresses 20 minutes 4 times daily Discontinue ibuprofen and take extra strength Tylenol 1 4 times daily if needed for severe pain Continue wearing the wrist brace and avoid doing heavy lifting with the left hand as much as possible We will call with the x-ray results tomorrow and the uric acid result tomorrow and if all of this is normal we will try a course of prednisone taper and continue with the extra strength Tylenol.  We will not do any prednisone though until the x-ray results are returned and the uric acid is returned. Check blood pressure readings at home and call back with results in a couple of weeks and watch sodium intake

## 2017-09-20 ENCOUNTER — Other Ambulatory Visit: Payer: Self-pay | Admitting: *Deleted

## 2017-09-20 LAB — URIC ACID: Uric Acid: 5.7 mg/dL (ref 3.7–8.6)

## 2017-09-20 MED ORDER — PREDNISONE 10 MG PO TABS: ORAL_TABLET | ORAL | 0 refills | Status: DC

## 2017-11-27 ENCOUNTER — Ambulatory Visit: Payer: Self-pay | Admitting: Family Medicine

## 2018-03-06 IMAGING — CT CT ABD-PELV W/O CM
2 of 4 series · 15 of 46 positions shown, 17 images · non-contrast
Comparison: CT abdomen and pelvis without and with contrast
01/21/2010.

CLINICAL DATA: 54-year-old presenting with 1 month history of right
lower quadrant abdominal pain and bloating, associated with a change
in bowel movements. Family history of colon cancer.

EXAM:
CT ABDOMEN AND PELVIS WITHOUT CONTRAST
TECHNIQUE: Multidetector CT imaging of the abdomen and pelvis was performed
following the standard protocol without IV contrast. Oral contrast
was administered.

[Series 2: routine abd pel without · axial · non-contrast · 0.64mm/px · z∈[+761,+1216]mm · 12 of 101 slices shown, 14 images]
[im 5/101  soft-tissue]
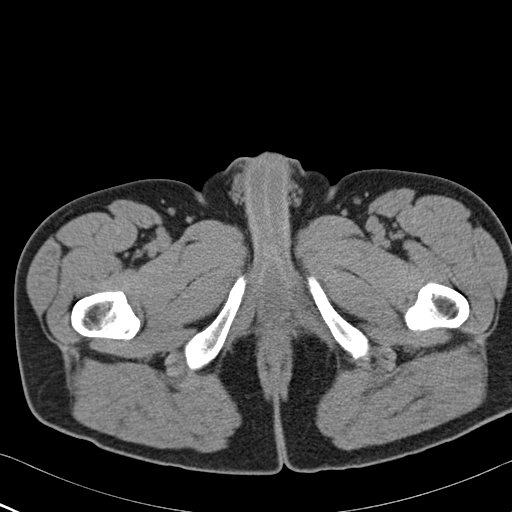
[im 5/101  bone]
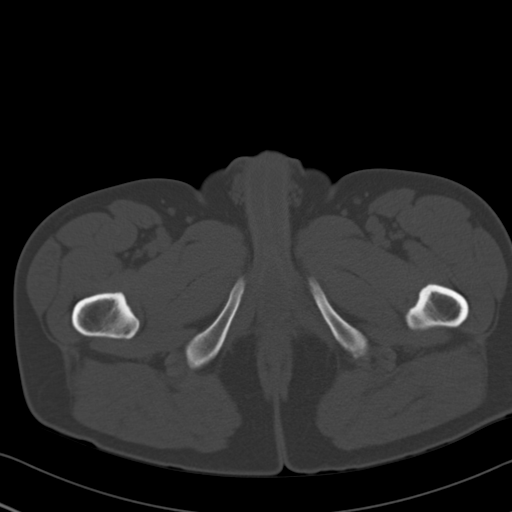
[im 14/101  soft-tissue]
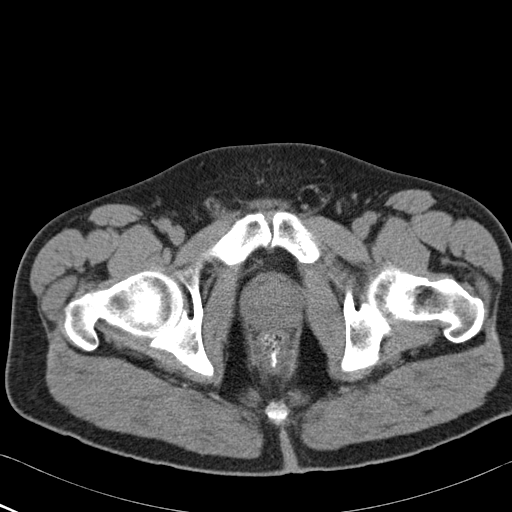
[im 22/101  soft-tissue]
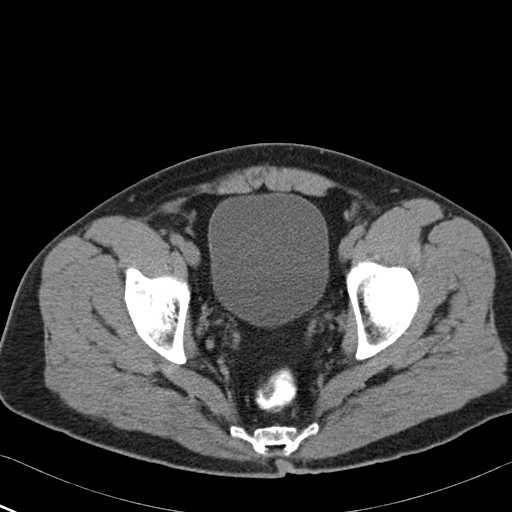
[im 31/101  soft-tissue]
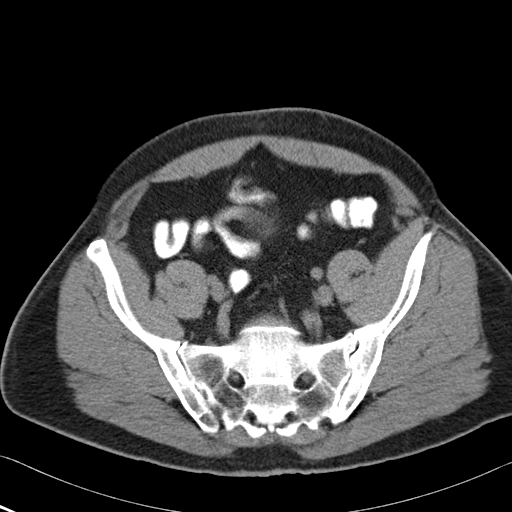
[im 40/101  soft-tissue]
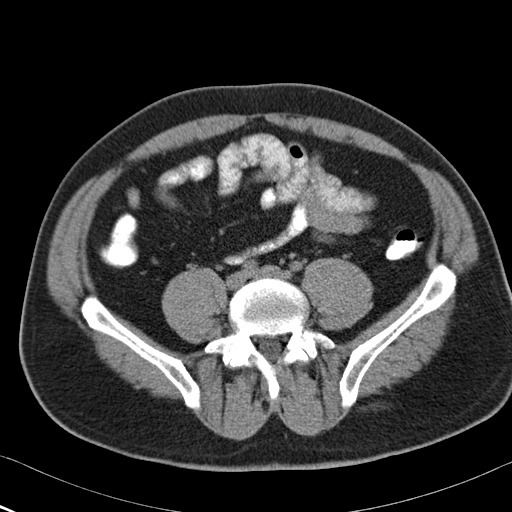
[im 48/101  soft-tissue]
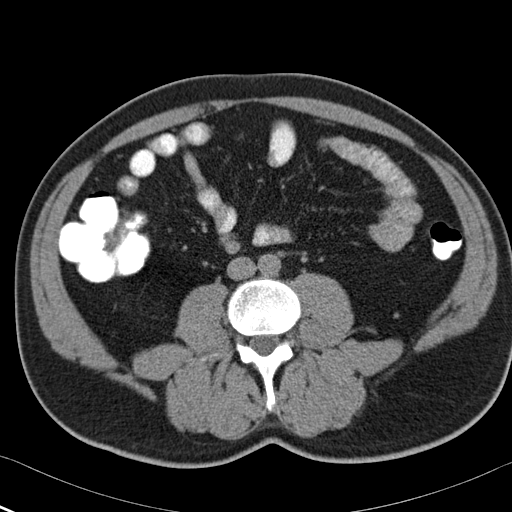
[im 53/101  soft-tissue]
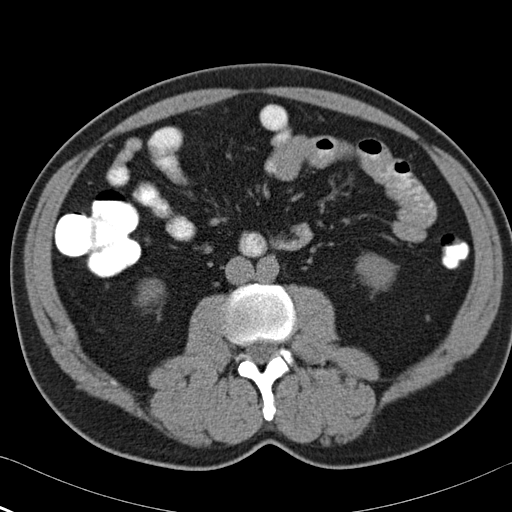
[im 61/101  soft-tissue]
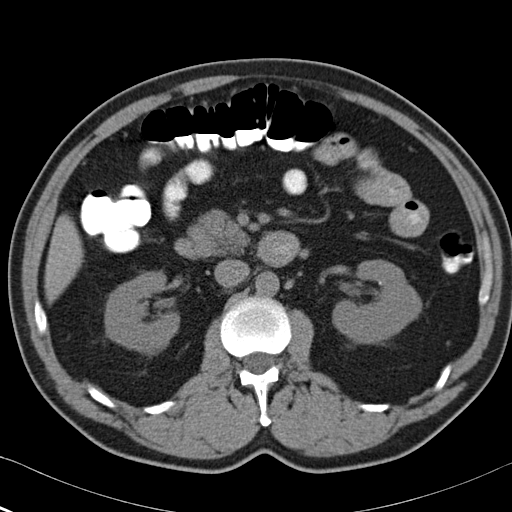
[im 70/101  soft-tissue]
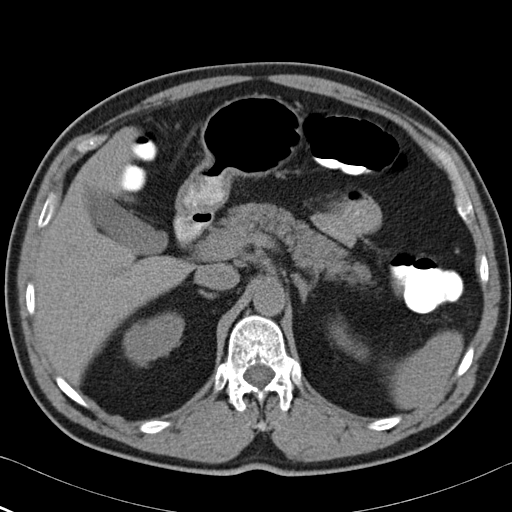
[im 70/101  bone]
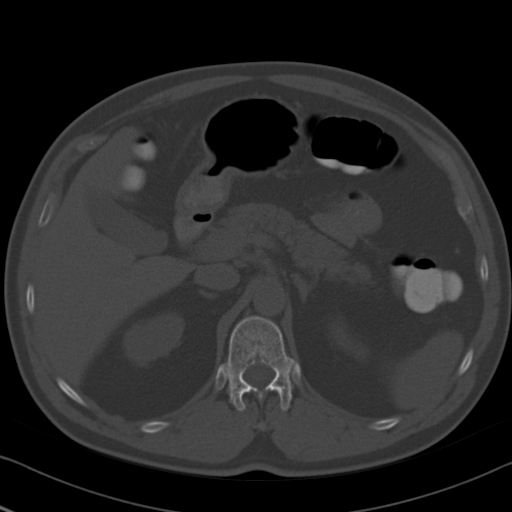
[im 79/101  soft-tissue]
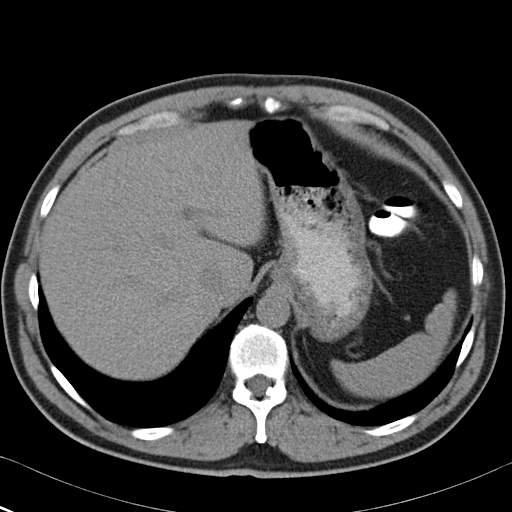
[im 87/101  soft-tissue]
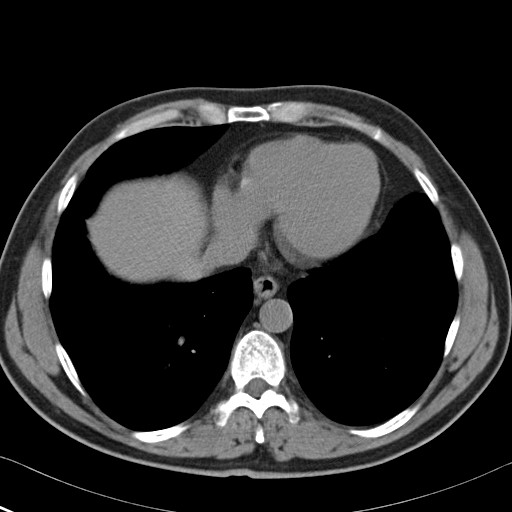
[im 96/101  soft-tissue]
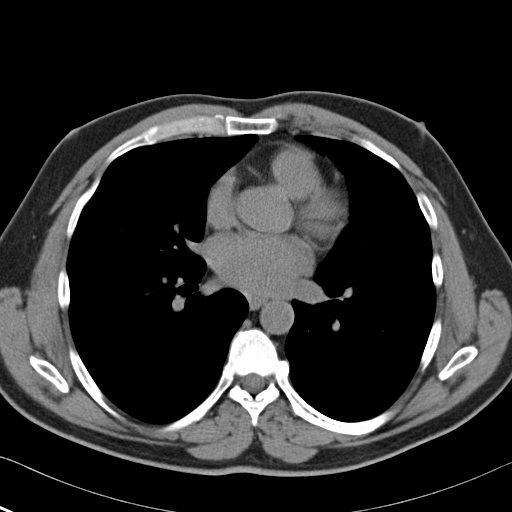

[Series 3: coronal · coronal · 0.69mm/px · 3 of 149 slices shown]
[im 50/149  soft-tissue]
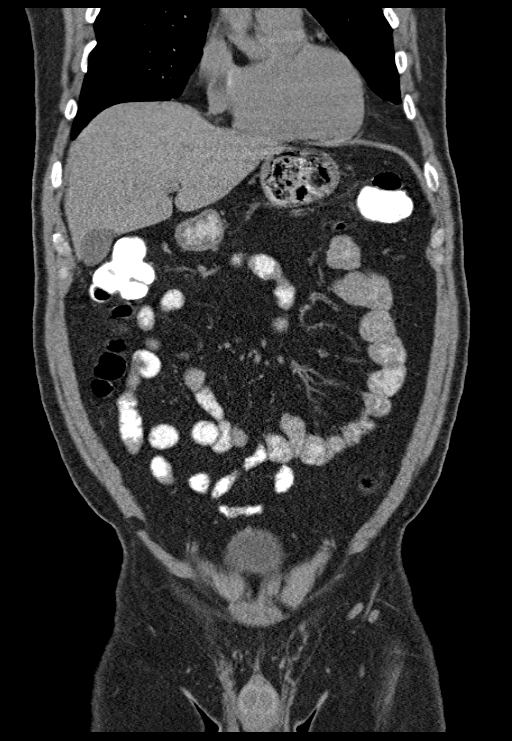
[im 66/149  soft-tissue]
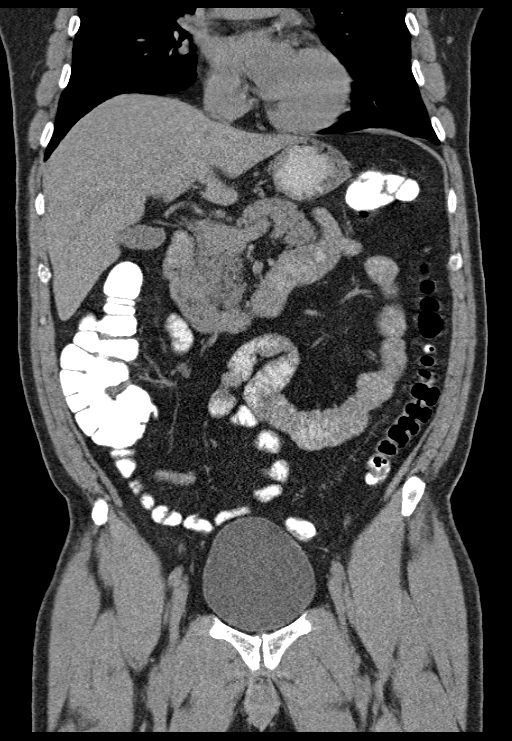
[im 83/149  soft-tissue]
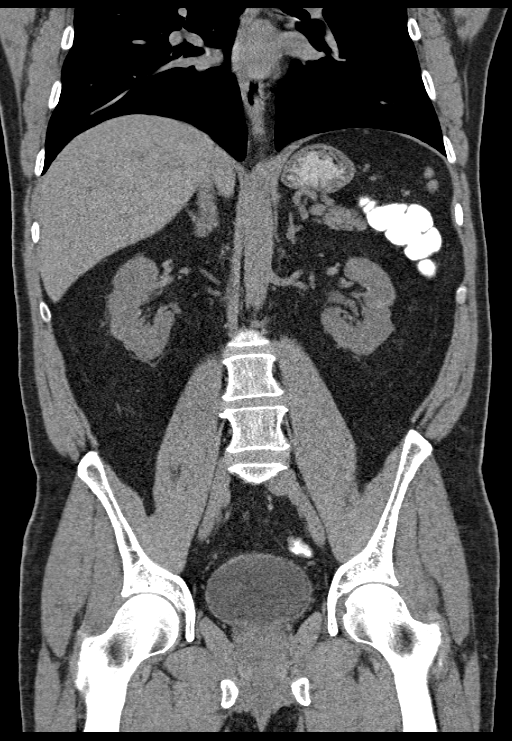

[15 of 46 positions shown; findings below may reference images not displayed]

FINDINGS: Lower chest:  Heart size normal.  Visualized lung bases clear.

Hepatobiliary: Normal unenhanced appearance of the liver.
Gallbladder normal in appearance without calcified gallstones. No
biliary ductal dilation.

Pancreas: Normal unenhanced appearance.

Spleen: Normal unenhanced appearance.

Adrenals/Urinary Tract: Normal appearing adrenal glands. Numerous
nonobstructing left renal calculi, the largest measuring
approximately 5 x 7 mm within a lower pole calix. Small
nonobstructing 1-2 mm calculi in a mid calix and a lower pole calix
of the right kidney. No obstructing ureteral calculi on either side.
Cortical cyst involving the left kidney identified on the prior CT
is less conspicuous on the unenhanced study. Allowing for the
unenhanced technique, no significant focal parenchymal abnormality
involving either kidney. No hydronephrosis. Normal-appearing urinary
bladder.

Stomach/Bowel: Stomach normal in appearance for the degree of
distention. Normal-appearing small bowel. Normal-appearing colon
with expected stool burden. Normal appendix in the right upper
pelvis.

Vascular/Lymphatic: No visible aortoiliofemoral atherosclerosis. No
pathologic lymphadenopathy.

Reproductive: Prostate gland and seminal vesicles normal in size and
appearance for age.

Other: Small phleboliths low in the left side of the pelvis.

Musculoskeletal: Left L5 pars defect without slip. No acute
abnormalities.
IMPRESSION: 1. No acute abnormalities involving the abdomen or pelvis.
2. Nonobstructing bilateral renal calculi, the largest measuring
approximately 5 x 7 mm within a lower pole calix of the left kidney.

## 2018-06-26 ENCOUNTER — Other Ambulatory Visit: Payer: Self-pay | Admitting: Family Medicine

## 2018-07-17 ENCOUNTER — Telehealth: Payer: Self-pay | Admitting: Family Medicine

## 2018-07-17 MED ORDER — ATENOLOL 25 MG PO TABS: 25.0000 mg | ORAL_TABLET | Freq: Every day | ORAL | 0 refills | Status: DC

## 2018-07-17 MED ORDER — ROSUVASTATIN CALCIUM 40 MG PO TABS: ORAL_TABLET | ORAL | 0 refills | Status: DC

## 2018-07-17 NOTE — Telephone Encounter (Signed)
Spoke with wife - appt made and refill sent to last until appt

## 2018-09-19 ENCOUNTER — Encounter: Payer: Self-pay | Admitting: Family Medicine

## 2018-09-19 ENCOUNTER — Ambulatory Visit (INDEPENDENT_AMBULATORY_CARE_PROVIDER_SITE_OTHER): Payer: Self-pay | Admitting: Family Medicine

## 2018-09-19 ENCOUNTER — Other Ambulatory Visit: Payer: Self-pay

## 2018-09-19 DIAGNOSIS — I10 Essential (primary) hypertension: Secondary | ICD-10-CM

## 2018-09-19 DIAGNOSIS — E7800 Pure hypercholesterolemia, unspecified: Secondary | ICD-10-CM

## 2018-09-19 DIAGNOSIS — Z800 Family history of malignant neoplasm of digestive organs: Secondary | ICD-10-CM

## 2018-09-19 DIAGNOSIS — N5201 Erectile dysfunction due to arterial insufficiency: Secondary | ICD-10-CM

## 2018-09-19 DIAGNOSIS — E559 Vitamin D deficiency, unspecified: Secondary | ICD-10-CM

## 2018-09-19 DIAGNOSIS — E349 Endocrine disorder, unspecified: Secondary | ICD-10-CM

## 2018-09-19 NOTE — Patient Instructions (Signed)
Continue with atenolol, Crestor, and aggressive therapeutic lifestyle changes Continue to practice good hand and respiratory hygiene To the office when insurance is in effect and get lab work done

## 2018-09-19 NOTE — Addendum Note (Signed)
Addended by: Zannie Cove on: 09/19/2018 03:51 PM   Modules accepted: Orders

## 2018-09-19 NOTE — Progress Notes (Signed)
Virtual Visit Via telephone Note I connected with@ on 09/19/18 by telephone and verified that I am speaking with the correct person or authorized healthcare agent using two identifiers. Jorge [name] is currently located at home and there are no unauthorized people in close proximity. I completed this visit while in a private location in my home office.  This visit type was conducted due to national recommendations for restrictions regarding the COVID-19 Pandemic (e.g. social distancing).  This format is felt to be most appropriate for this patient at this time.  All issues noted in this document were discussed and addressed.  No physical exam was performed.    I discussed the limitations, risks, security and privacy concerns of performing an evaluation and management service by telephone and the availability of in person appointments. I also discussed with the patient that there may be a patient responsible charge related to this service. The patient expressed understanding and agreed to proceed.   Date:  09/19/2018    ID:  Jorge [name]      1961-03-28        [id number]   Patient Care Team Patient Care Team: Chipper Herb, MD as PCP - General (Family Medicine)  Reason for Visit: Primary Care Follow-up     History of Present Illness & Review of Systems:     Jorge [name] is a 58 y.o. year old male primary care patient that presents today for a telehealth visit.  The patient is pleasant and doing well.  He denies any chest pain pressure tightness or shortness of breath.  He denies any trouble with his stomach including issues with swallowing nausea vomiting heartburn diarrhea blood in the stool or black tarry bowel movements.  He did have a colon polyp at his last colonoscopy which was in October 2017 by Dr. Elmo Putt and was told that he would need another colonoscopy in 5 years.  We taken the omeprazole he is not having any heartburn currently.  He is passing his water well.  We did  discuss testosterone replacement and he cannot afford that now but did mention that injectable testosterone might be less expensive and he is aware of that.  He hopes to get insurance soon and he will come and get blood work including routine labs testosterone PSA uric acid and FOBT when he is able to have this covered by insurance.  He understands he should come back and be seen in 4 to 6 months.  He will keep checking his blood pressure at home.  Review of systems as stated, otherwise negative.  The patient does not have symptoms concerning for COVID-19 infection (fever, chills, cough, or new shortness of breath).      Current Medications (Verified) Allergies as of 09/19/2018      Reactions   Penicillins Rash      Medication List       Accurate as of September 19, 2018  7:35 AM. If you have any questions, ask your nurse or doctor.        atenolol 25 MG tablet Commonly known as:  TENORMIN Take 1 tablet (25 mg total) by mouth daily. (Needs to be seen before next refill)   cholecalciferol 1000 units tablet Commonly known as:  VITAMIN D Take 2,000 Units by mouth daily.   fish oil-omega-3 fatty acids 1000 MG capsule Take 1,200 mg by mouth daily.   omeprazole 20 MG tablet Commonly known as:  PRILOSEC OTC Take 20 mg  by mouth daily.   predniSONE 10 MG tablet Commonly known as:  DELTASONE Take 1 tab QID x 2 days, then 1 tab TID x 2 days, then 1 tab BID x 2 days, then 1 tab QD x 2 days   rosuvastatin 40 MG tablet Commonly known as:  CRESTOR TAKE ONE (1) TABLET EACH DAY   Testosterone 20.25 MG/ACT (1.62%) Gel Commonly known as:  AndroGel Pump Place 2 application onto the skin daily.   UNABLE TO FIND Med Name: DHEA 100 mg once a day  (for OTC testosterone)           Allergies (Verified)    Penicillins  Past Medical History Past Medical History:  Diagnosis Date  . Abnormal liver enzymes   . Duodenitis   . Hemorrhoids   . Hiatal hernia   . Hyperlipidemia   . Hypertension    . Kidney stones   . Testosterone deficiency   . Tubular adenoma of colon      Past Surgical History:  Procedure Laterality Date  . LITHOTRIPSY    . VASECTOMY      Social History   Socioeconomic History  . Marital status: Married    Spouse name: Not on file  . Number of children: 1  . Years of education: Not on file  . Highest education level: Not on file  Occupational History  . Occupation: Management  Social Needs  . Financial resource strain: Not on file  . Food insecurity:    Worry: Not on file    Inability: Not on file  . Transportation needs:    Medical: Not on file    Non-medical: Not on file  Tobacco Use  . Smoking status: Never Smoker  . Smokeless tobacco: Never Used  Substance and Sexual Activity  . Alcohol use: No  . Drug use: No  . Sexual activity: Not on file  Lifestyle  . Physical activity:    Days per week: Not on file    Minutes per session: Not on file  . Stress: Not on file  Relationships  . Social connections:    Talks on phone: Not on file    Gets together: Not on file    Attends religious service: Not on file    Active member of club or organization: Not on file    Attends meetings of clubs or organizations: Not on file    Relationship status: Not on file  Other Topics Concern  . Not on file  Social History Narrative  . Not on file     Family History  Problem Relation Age of Onset  . Heart disease Mother 63       Heart Valve Disease  . Hypertension Mother   . Diabetes Father   . Colon cancer Father       Labs/Other Tests and Data Reviewed:    Wt Readings from Last 3 Encounters:  09/19/17 162 lb (73.5 kg)  08/23/17 161 lb (73 kg)  04/12/17 163 lb (73.9 kg)   Temp Readings from Last 3 Encounters:  09/19/17 98.4 F (36.9 C) (Oral)  08/23/17 97.7 F (36.5 C) (Oral)  04/12/17 99.2 F (37.3 C) (Oral)   BP Readings from Last 3 Encounters:  09/19/17 (!) 154/77  08/23/17 129/74  04/12/17 136/72   Pulse Readings from  Last 3 Encounters:  09/19/17 65  08/23/17 65  04/12/17 73     No results found for: HGBA1C Lab Results  Component Value Date   LDLCALC 38 11/01/2012  CREATININE 1.03 12/02/2015       Chemistry      Component Value Date/Time   NA 140 12/02/2015 1130   NA 142 11/06/2015 1050   K 5.0 12/02/2015 1130   CL 103 12/02/2015 1130   CO2 31 12/02/2015 1130   BUN 12 12/02/2015 1130   BUN 10 11/06/2015 1050   CREATININE 1.03 12/02/2015 1130   CREATININE 1.03 11/01/2012 0826      Component Value Date/Time   CALCIUM 9.8 12/02/2015 1130   ALKPHOS 57 12/02/2015 1130   AST 19 12/02/2015 1130   ALT 29 12/02/2015 1130   BILITOT 0.4 12/02/2015 1130   BILITOT 0.3 10/19/2015 1234         OBSERVATIONS/ OBJECTIVE:     The patient is pleasant and alert and is trying to maintain good health care with the help of his wife who encourages him to watch his weight and diet and eat healthy.  His weight is about 161.  His blood pressures averaging about 127/75.  We will put a future order in for blood work and the patient will come and get this is since his insurance can pay for this.  He cannot afford AndroGel and we did discuss the option of using injectable testosterone.  Physical exam deferred due to nature of telephonic visit.  ASSESSMENT & PLAN    Time:   Today, I have spent 17 minutes with the patient via telephone discussing the above including Covid precautions.     Visit Diagnoses: Hypertension Hyperlipidemia Testosterone deficiency  Essential hypertension  Pure hypercholesterolemia  Family history of colon cancer  Testosterone deficiency  Erectile dysfunction due to arterial insufficiency    Patient Instructions  Continue with atenolol, Crestor, and aggressive therapeutic lifestyle changes Continue to practice good hand and respiratory hygiene To the office when insurance is in effect and get lab work done       The above assessment and management plan was  discussed with the patient. The patient verbalized understanding of and has agreed to the management plan. Patient is aware to call the clinic if symptoms persist or worsen. Patient is aware when to return to the clinic for a follow-up visit. Patient educated on when it is appropriate to go to the emergency department.    Chipper Herb, MD [address] [place], [place],  [address] Ph [phone]   Arrie Senate MD

## 2018-09-28 ENCOUNTER — Telehealth: Payer: Self-pay | Admitting: Family Medicine

## 2018-09-28 MED ORDER — ROSUVASTATIN CALCIUM 40 MG PO TABS: ORAL_TABLET | ORAL | 1 refills | Status: DC

## 2018-09-28 MED ORDER — ATENOLOL 25 MG PO TABS: 25.0000 mg | ORAL_TABLET | Freq: Every day | ORAL | 1 refills | Status: DC

## 2018-09-28 NOTE — Telephone Encounter (Signed)
Refills sent patient aware. ?

## 2018-12-21 ENCOUNTER — Encounter: Payer: Self-pay | Admitting: Nurse Practitioner

## 2018-12-21 ENCOUNTER — Ambulatory Visit (INDEPENDENT_AMBULATORY_CARE_PROVIDER_SITE_OTHER): Payer: 59 | Admitting: Nurse Practitioner

## 2018-12-21 ENCOUNTER — Other Ambulatory Visit: Payer: Self-pay

## 2018-12-21 VITALS — BP 144/77 | HR 56 | Temp 98.7°F | Resp 20 | Ht 64.0 in | Wt 157.0 lb

## 2018-12-21 DIAGNOSIS — R002 Palpitations: Secondary | ICD-10-CM | POA: Diagnosis not present

## 2018-12-21 DIAGNOSIS — E349 Endocrine disorder, unspecified: Secondary | ICD-10-CM | POA: Diagnosis not present

## 2018-12-21 DIAGNOSIS — E7800 Pure hypercholesterolemia, unspecified: Secondary | ICD-10-CM

## 2018-12-21 DIAGNOSIS — I10 Essential (primary) hypertension: Secondary | ICD-10-CM

## 2018-12-21 NOTE — Patient Instructions (Signed)

## 2018-12-21 NOTE — Progress Notes (Signed)
Subjective:    Patient ID: Jorge [name], male    DOB: 03/11/1961, 58 y.o.   MRN: [id number]   Chief Complaint: Palpitations (Last 2 days)   HPI Patient said yesterday and last night he felt like his heart was skipping a beat. Says feels like "his heart stops". He says the last 2 weeks he has been checking his blood pressure and it usually runs around 130's but has had a couple of readings in the 150's. He has not had a check up in years because he had no insurance.   Review of Systems  Constitutional: Negative for activity change and appetite change.  HENT: Negative.   Eyes: Negative for pain.  Respiratory: Negative for shortness of breath.   Cardiovascular: Negative for chest pain, palpitations and leg swelling.  Gastrointestinal: Negative for abdominal pain.  Endocrine: Negative for polydipsia.  Genitourinary: Negative.   Skin: Negative for rash.  Neurological: Negative for dizziness, weakness and headaches.  Hematological: Does not bruise/bleed easily.  Psychiatric/Behavioral: Negative.   All other systems reviewed and are negative.      Objective:   Physical Exam Vitals signs and nursing note reviewed.  Constitutional:      Appearance: Normal appearance. He is well-developed.  HENT:     Head: Normocephalic.     Nose: Nose normal.  Eyes:     Pupils: Pupils are equal, round, and reactive to light.  Neck:     Musculoskeletal: Normal range of motion and neck supple.     Thyroid: No thyroid mass or thyromegaly.     Vascular: No carotid bruit or JVD.     Trachea: Phonation normal.  Cardiovascular:     Rate and Rhythm: Normal rate and regular rhythm.  Pulmonary:     Effort: Pulmonary effort is normal. No respiratory distress.     Breath sounds: Normal breath sounds.  Abdominal:     General: Bowel sounds are normal.     Palpations: Abdomen is soft.     Tenderness: There is no abdominal tenderness.  Musculoskeletal: Normal range of motion.  Lymphadenopathy:   Cervical: No cervical adenopathy.  Skin:    General: Skin is warm and dry.  Neurological:     Mental Status: He is alert and oriented to person, place, and time.  Psychiatric:        Behavior: Behavior normal.        Thought Content: Thought content normal.        Judgment: Judgment normal.    BP (!) 144/77   Pulse (!) 56   Temp 98.7 F (37.1 C) (Oral)   Resp 20   Ht 5' 4" (1.626 m)   Wt 157 lb (71.2 kg)   SpO2 96%   BMI 26.95 kg/m         Assessment & Plan:  Jorge [name] comes in today with chief complaint of Palpitations (Last 2 days)   Diagnosis and orders addressed:  1. Palpitations Keep diary of epsodes - EKG 12-Lead  2. Testosterone deficiency Will recheck blood today  3. Pure hypercholesterolemia Low fat diet  4. Essential hypertension Low sodium diet Keep diary of blood pressure  Labs pending Orders Placed This Encounter  Procedures  . CMP14+EGFR  . Lipid panel  . EKG 12-Lead    Health Maintenance reviewed Diet and exercise encouraged  Follow up plan: 1 months   [name] Hassell Done, FNP

## 2018-12-23 LAB — TESTOSTERONE,FREE AND TOTAL
Testosterone, Free: 8.2 pg/mL (ref 7.2–24.0)
Testosterone: 114 ng/dL — ABNORMAL LOW (ref 264–916)

## 2018-12-23 LAB — CMP14+EGFR
ALT: 73 IU/L — ABNORMAL HIGH (ref 0–44)
AST: 38 IU/L (ref 0–40)
Albumin/Globulin Ratio: 2 (ref 1.2–2.2)
Albumin: 4.6 g/dL (ref 3.8–4.9)
Alkaline Phosphatase: 72 IU/L (ref 39–117)
BUN/Creatinine Ratio: 11 (ref 9–20)
BUN: 12 mg/dL (ref 6–24)
Bilirubin Total: 0.6 mg/dL (ref 0.0–1.2)
CO2: 26 mmol/L (ref 20–29)
Calcium: 9.9 mg/dL (ref 8.7–10.2)
Chloride: 104 mmol/L (ref 96–106)
Creatinine, Ser: 1.05 mg/dL (ref 0.76–1.27)
GFR calc Af Amer: 91 mL/min/{1.73_m2} (ref >59)
GFR calc non Af Amer: 79 mL/min/{1.73_m2} (ref >59)
Globulin, Total: 2.3 g/dL (ref 1.5–4.5)
Glucose: 112 mg/dL — ABNORMAL HIGH (ref 65–99)
Potassium: 4.9 mmol/L (ref 3.5–5.2)
Sodium: 144 mmol/L (ref 134–144)
Total Protein: 6.9 g/dL (ref 6.0–8.5)

## 2018-12-23 LAB — LIPID PANEL
Chol/HDL Ratio: 2.9 ratio (ref 0.0–5.0)
Cholesterol, Total: 105 mg/dL (ref 100–199)
HDL: 36 mg/dL — ABNORMAL LOW (ref >39)
LDL Chol Calc (NIH): 48 mg/dL (ref 0–99)
Triglycerides: 111 mg/dL (ref 0–149)
VLDL Cholesterol Cal: 21 mg/dL (ref 5–40)

## 2019-01-17 ENCOUNTER — Other Ambulatory Visit: Payer: Self-pay

## 2019-01-18 ENCOUNTER — Encounter: Payer: Self-pay | Admitting: Nurse Practitioner

## 2019-01-18 ENCOUNTER — Ambulatory Visit (INDEPENDENT_AMBULATORY_CARE_PROVIDER_SITE_OTHER): Payer: 59 | Admitting: Nurse Practitioner

## 2019-01-18 VITALS — BP 145/72 | HR 58 | Temp 98.6°F | Resp 16 | Ht 64.0 in | Wt 162.0 lb

## 2019-01-18 DIAGNOSIS — E559 Vitamin D deficiency, unspecified: Secondary | ICD-10-CM | POA: Diagnosis not present

## 2019-01-18 DIAGNOSIS — Z6826 Body mass index (BMI) 26.0-26.9, adult: Secondary | ICD-10-CM

## 2019-01-18 DIAGNOSIS — E8881 Metabolic syndrome: Secondary | ICD-10-CM

## 2019-01-18 DIAGNOSIS — Z23 Encounter for immunization: Secondary | ICD-10-CM

## 2019-01-18 DIAGNOSIS — E349 Endocrine disorder, unspecified: Secondary | ICD-10-CM

## 2019-01-18 DIAGNOSIS — E7800 Pure hypercholesterolemia, unspecified: Secondary | ICD-10-CM | POA: Diagnosis not present

## 2019-01-18 DIAGNOSIS — K219 Gastro-esophageal reflux disease without esophagitis: Secondary | ICD-10-CM

## 2019-01-18 MED ORDER — ROSUVASTATIN CALCIUM 40 MG PO TABS: ORAL_TABLET | ORAL | 1 refills | Status: DC

## 2019-01-18 MED ORDER — TESTOSTERONE CYPIONATE 200 MG/ML IM SOLN: 200.0000 mg | INTRAMUSCULAR | 2 refills | Status: DC

## 2019-01-18 MED ORDER — OMEPRAZOLE MAGNESIUM 20 MG PO TBEC: 20.0000 mg | DELAYED_RELEASE_TABLET | Freq: Every day | ORAL | 1 refills | Status: DC

## 2019-01-18 MED ORDER — TESTOSTERONE CYPIONATE 200 MG/ML IM SOLN
200.0000 mg | INTRAMUSCULAR | Status: AC
  Administered 2019-01-18 – 2020-08-24 (×20): 200 mg via INTRAMUSCULAR

## 2019-01-18 NOTE — Progress Notes (Signed)
Subjective:    Patient ID: Jorge [name], male    DOB: 08/02/1960, 58 y.o.   MRN: [id number]   Chief Complaint: Follow up blood pressure    HPI:  1. Pure hypercholesterolemia Tries to watch diet but does very little exercise. Is on crestor daily Lab Results  Component Value Date   CHOL 105 12/21/2018   HDL 36 (L) 12/21/2018   LDLCALC 48 12/21/2018   TRIG 111 12/21/2018   CHOLHDL 2.9 12/21/2018     2. Metabolic syndrome He does not check his blood sugars at home nor does he watch carbs in diet. He has no recent HGBA1C in his chart.   3. Testosterone deficiency Use to be on andro gel and did not seem to help. Wants to go on shots.te Lab Results  Component Value Date   TESTOSTERONE 114 (L) 12/21/2018     4. Vitamin D deficiency Takes a daily vitamin d supplement when he can remember to take  5.  GERD On daily omeprazole that works well  6. BMI 26.0-26.9,adult No recent weight changes Wt Readings from Last 3 Encounters:  01/18/19 162 lb (73.5 kg)  12/21/18 157 lb (71.2 kg)  09/19/17 162 lb (73.5 kg)   BMI Readings from Last 3 Encounters:  01/18/19 27.81 kg/m  12/21/18 26.95 kg/m  09/19/17 27.81 kg/m    He has been checking blood pressure at home an dit has been running below [id number] systolic consistently. His blood pressure is always high when he comes to DR. Office. He is on metoprolol for history of elevated heart rate. BP Readings from Last 3 Encounters:  01/18/19 (!) 145/72  12/21/18 (!) 144/77  09/19/17 (!) 154/77      Outpatient Encounter Medications as of 01/18/2019  Medication Sig  . atenolol (TENORMIN) 25 MG tablet Take 1 tablet (25 mg total) by mouth daily. (Needs to be seen before next refill)  . cholecalciferol (VITAMIN D) 1000 UNITS tablet Take 2,000 Units by mouth daily.    . Multiple Vitamins-Minerals (MULTIVITAMIN ADULT PO) Take by mouth.  [name] Kitchen omeprazole (PRILOSEC OTC) 20 MG tablet Take 20 mg by mouth daily.  . rosuvastatin (CRESTOR) 40  MG tablet TAKE ONE (1) TABLET EACH DAY  . vitamin C (ASCORBIC ACID) 500 MG tablet Take 500 mg by mouth daily.     Past Surgical History:  Procedure Laterality Date  . LITHOTRIPSY    . VASECTOMY      Family History  Problem Relation Age of Onset  . Heart disease Mother 36       Heart Valve Disease  . Hypertension Mother   . Diabetes Father   . Colon cancer Father     New complaints: None today  Social history: Loves to go camping  Controlled substance contract: n/a    Review of Systems  Constitutional: Negative for activity change and appetite change.  HENT: Negative.   Eyes: Negative for pain.  Respiratory: Negative for shortness of breath.   Cardiovascular: Negative for chest pain, palpitations and leg swelling.  Gastrointestinal: Negative for abdominal pain.  Endocrine: Negative for polydipsia.  Genitourinary: Negative.   Skin: Negative for rash.  Neurological: Negative for dizziness, weakness and headaches.  Hematological: Does not bruise/bleed easily.  Psychiatric/Behavioral: Negative.   All other systems reviewed and are negative.      Objective:   Physical Exam Vitals signs and nursing note reviewed.  Constitutional:      Appearance: Normal appearance. He is well-developed.  HENT:  Head: Normocephalic.     Nose: Nose normal.  Eyes:     Pupils: Pupils are equal, round, and reactive to light.  Neck:     Musculoskeletal: Normal range of motion and neck supple.     Thyroid: No thyroid mass or thyromegaly.     Vascular: No carotid bruit or JVD.     Trachea: Phonation normal.  Cardiovascular:     Rate and Rhythm: Normal rate and regular rhythm.  Pulmonary:     Effort: Pulmonary effort is normal. No respiratory distress.     Breath sounds: Normal breath sounds.  Abdominal:     General: Bowel sounds are normal.     Palpations: Abdomen is soft.     Tenderness: There is no abdominal tenderness.  Musculoskeletal: Normal range of motion.   Lymphadenopathy:     Cervical: No cervical adenopathy.  Skin:    General: Skin is warm and dry.  Neurological:     Mental Status: He is alert and oriented to person, place, and time.  Psychiatric:        Behavior: Behavior normal.        Thought Content: Thought content normal.        Judgment: Judgment normal.     BP (!) 145/72   Pulse (!) 58   Temp 98.6 F (37 C) (Oral)   Resp 16   Ht 5\' 4"  (1.626 m)   Wt 162 lb (73.5 kg)   SpO2 97%   BMI 27.81 kg/m        Assessment & Plan:  Jorge [name] comes in today with chief complaint of Follow up blood pressure   Diagnosis and orders addressed:  1. Pure hypercholesterolemia Low fat diet - rosuvastatin (CRESTOR) 40 MG tablet; TAKE ONE (1) TABLET EACH DAY  Dispense: 180 tablet; Refill: 1  2. Metabolic syndrome Watch carbs in diet  3. Testosterone deficiency Start monthly testosterone  injections - testosterone cypionate (DEPOTESTOSTERONE CYPIONATE) 200 MG/ML injection; Inject 1 mL (200 mg total) into the muscle every 28 (twenty-eight) days.  Dispense: 10 mL; Refill: 2  4. Vitamin D deficiency Continue daily vitamin d   5. BMI 26.0-26.9,adult Discussed diet and exercise for person with BMI >25 Will recheck weight in 3-6 months   6. Gastroesophageal reflux disease without esophagitis ontinue daily omeprazole - omeprazole (PRILOSEC OTC) 20 MG tablet; Take 1 tablet (20 mg total) by mouth daily.  Dispense: 90 tablet; Refill: 1   Labs pending Health Maintenance reviewed Diet and exercise encouraged  Follow up plan: 6 months   [name] Hassell Done, FNP

## 2019-01-18 NOTE — Addendum Note (Signed)
Addended by: Michaela Corner on: 01/18/2019 03:52 PM   Modules accepted: Orders

## 2019-01-21 ENCOUNTER — Telehealth: Payer: Self-pay | Admitting: Nurse Practitioner

## 2019-01-21 NOTE — Telephone Encounter (Signed)
Aware of provider's advice. 

## 2019-01-21 NOTE — Telephone Encounter (Signed)
At lunch yesterday, after resting for quite awhile, his blood pressures were 147/85 and 138/72 with heart rates of 106, 107.  He has not taken the atenolol.  Please advise if he should be concerned with the heart rate?  He did not consume any caffeine that day.

## 2019-01-21 NOTE — Telephone Encounter (Signed)
Go ahead and take atenolol and continue to monitor. If no better I need to see him tomorrow

## 2019-01-23 ENCOUNTER — Other Ambulatory Visit: Payer: Self-pay | Admitting: *Deleted

## 2019-01-23 DIAGNOSIS — Z20822 Contact with and (suspected) exposure to covid-19: Secondary | ICD-10-CM

## 2019-01-25 LAB — NOVEL CORONAVIRUS, NAA: SARS-CoV-2, NAA: NOT DETECTED

## 2019-02-15 ENCOUNTER — Other Ambulatory Visit: Payer: Self-pay

## 2019-02-18 ENCOUNTER — Other Ambulatory Visit: Payer: Self-pay

## 2019-02-18 ENCOUNTER — Ambulatory Visit (INDEPENDENT_AMBULATORY_CARE_PROVIDER_SITE_OTHER): Payer: 59 | Admitting: *Deleted

## 2019-02-18 DIAGNOSIS — E349 Endocrine disorder, unspecified: Secondary | ICD-10-CM | POA: Diagnosis not present

## 2019-03-20 ENCOUNTER — Ambulatory Visit (INDEPENDENT_AMBULATORY_CARE_PROVIDER_SITE_OTHER): Payer: 59

## 2019-03-20 ENCOUNTER — Other Ambulatory Visit: Payer: Self-pay

## 2019-03-20 DIAGNOSIS — E349 Endocrine disorder, unspecified: Secondary | ICD-10-CM

## 2019-03-20 NOTE — Progress Notes (Signed)
Testosterone injection given to left upper outer quadrant.  Patient tolerated well. 

## 2019-04-22 ENCOUNTER — Ambulatory Visit (INDEPENDENT_AMBULATORY_CARE_PROVIDER_SITE_OTHER): Payer: 59 | Admitting: *Deleted

## 2019-04-22 ENCOUNTER — Other Ambulatory Visit: Payer: Self-pay

## 2019-04-22 DIAGNOSIS — E349 Endocrine disorder, unspecified: Secondary | ICD-10-CM | POA: Diagnosis not present

## 2019-04-22 NOTE — Progress Notes (Signed)
Testosterone injection and tolerated well.

## 2019-05-21 ENCOUNTER — Ambulatory Visit (INDEPENDENT_AMBULATORY_CARE_PROVIDER_SITE_OTHER): Payer: 59 | Admitting: *Deleted

## 2019-05-21 ENCOUNTER — Other Ambulatory Visit: Payer: Self-pay

## 2019-05-21 DIAGNOSIS — E349 Endocrine disorder, unspecified: Secondary | ICD-10-CM | POA: Diagnosis not present

## 2019-06-17 ENCOUNTER — Other Ambulatory Visit: Payer: Self-pay

## 2019-06-17 ENCOUNTER — Other Ambulatory Visit: Payer: Self-pay | Admitting: Family Medicine

## 2019-06-17 ENCOUNTER — Telehealth: Payer: Self-pay | Admitting: Family Medicine

## 2019-06-17 ENCOUNTER — Ambulatory Visit (INDEPENDENT_AMBULATORY_CARE_PROVIDER_SITE_OTHER): Payer: 59

## 2019-06-17 DIAGNOSIS — R002 Palpitations: Secondary | ICD-10-CM

## 2019-06-17 DIAGNOSIS — E349 Endocrine disorder, unspecified: Secondary | ICD-10-CM | POA: Diagnosis not present

## 2019-06-17 DIAGNOSIS — I10 Essential (primary) hypertension: Secondary | ICD-10-CM

## 2019-06-17 MED ORDER — ATENOLOL 25 MG PO TABS: 25.0000 mg | ORAL_TABLET | Freq: Every day | ORAL | 3 refills | Status: DC

## 2019-06-17 NOTE — Telephone Encounter (Signed)
Atenolol sent.

## 2019-06-17 NOTE — Telephone Encounter (Signed)
Pt aware.

## 2019-06-17 NOTE — Progress Notes (Signed)
Testosterone injection given to right upper outer quadrant.  Patient tolerated well. 

## 2019-06-17 NOTE — Telephone Encounter (Signed)
  Medication Request  06/17/2019  What is the name of the medication?  Atenolol  Have you contacted your pharmacy to request a refill? No  Which pharmacy would you like this sent to?  [facility] [place]  Pt wanted to let Dr Thayer Ohm know that he has an upcoming appt with her on 07/22/19 but says he will run out of his Atenolol Rx before then. Requested to have it refilled.  Patient notified that their request is being sent to the clinical staff for review and that they should receive a call once it is complete. If they do not receive a call within 24 hours they can check with their pharmacy or our office.

## 2019-07-05 ENCOUNTER — Ambulatory Visit: Payer: Self-pay | Attending: Internal Medicine

## 2019-07-05 DIAGNOSIS — Z23 Encounter for immunization: Secondary | ICD-10-CM

## 2019-07-05 NOTE — Progress Notes (Signed)
   Covid-19 Vaccination Clinic  Name:  Jorge [name]    MRN: [id number] DOB: 06/28/1959  07/05/2019  Jorge [name] was observed post Covid-19 immunization for 15 minutes without incident. He was provided with Vaccine Information Sheet and instruction to access the V-Safe system.   Jorge [name] was instructed to call 911 with any severe reactions post vaccine: [name] Kitchen Difficulty breathing  . Swelling of face and throat  . A fast heartbeat  . A bad rash all over body  . Dizziness and weakness   Immunizations Administered    Name Date Dose VIS Date Route   Pfizer COVID-19 Vaccine 07/05/2019  8:55 AM 0.3 mL 03/29/2019 Intramuscular   Manufacturer: [place]   Lot: [id number]   [place]: [id number]

## 2019-07-16 ENCOUNTER — Ambulatory Visit (INDEPENDENT_AMBULATORY_CARE_PROVIDER_SITE_OTHER): Payer: 59 | Admitting: *Deleted

## 2019-07-16 ENCOUNTER — Other Ambulatory Visit: Payer: Self-pay

## 2019-07-16 DIAGNOSIS — E349 Endocrine disorder, unspecified: Secondary | ICD-10-CM | POA: Diagnosis not present

## 2019-07-16 NOTE — Patient Instructions (Signed)
Testosterone injection What is this medicine? TESTOSTERONE (tes TOS ter one) is the main male hormone. It supports normal male development such as muscle growth, facial hair, and deep voice. It is used in males to treat low testosterone levels. This medicine may be used for other purposes; ask your health care provider or pharmacist if you have questions. COMMON BRAND NAME(S): Andro-L.A., Aveed, Delatestryl, Depo-Testosterone, Virilon What should I tell my health care provider before I take this medicine? They need to know if you have any of these conditions:  cancer  diabetes  heart disease  kidney disease  liver disease  lung disease  prostate disease  an unusual or allergic reaction to testosterone, other medicines, foods, dyes, or preservatives  pregnant or trying to get pregnant  breast-feeding How should I use this medicine? This medicine is for injection into a muscle. It is usually given by a health care professional in a hospital or clinic setting. Contact your pediatrician regarding the use of this medicine in children. While this medicine may be prescribed for children as young as 54 years of age for selected conditions, precautions do apply. Overdosage: If you think you have taken too much of this medicine contact a poison control center or emergency room at once. NOTE: This medicine is only for you. Do not share this medicine with others. What if I miss a dose? Try not to miss a dose. Your doctor or health care professional will tell you when your next injection is due. Notify the office if you are unable to keep an appointment. What may interact with this medicine?  medicines for diabetes  medicines that treat or prevent blood clots like warfarin  oxyphenbutazone  propranolol  steroid medicines like prednisone or cortisone This list may not describe all possible interactions. Give your health care provider a list of all the medicines, herbs, non-prescription  drugs, or dietary supplements you use. Also tell them if you smoke, drink alcohol, or use illegal drugs. Some items may interact with your medicine. What should I watch for while using this medicine? Visit your doctor or health care professional for regular checks on your progress. They will need to check the level of testosterone in your blood. This medicine is only approved for use in men who have low levels of testosterone related to certain medical conditions. Heart attacks and strokes have been reported with the use of this medicine. Notify your doctor or health care professional and seek emergency treatment if you develop breathing problems; changes in vision; confusion; chest pain or chest tightness; sudden arm pain; severe, sudden headache; trouble speaking or understanding; sudden numbness or weakness of the face, arm or leg; loss of balance or coordination. Talk to your doctor about the risks and benefits of this medicine. This medicine may affect blood sugar levels. If you have diabetes, check with your doctor or health care professional before you change your diet or the dose of your diabetic medicine. Testosterone injections are not commonly used in women. Women should inform their doctor if they wish to become pregnant or think they might be pregnant. There is a potential for serious side effects to an unborn child. Talk to your health care professional or pharmacist for more information. Talk with your doctor or health care professional about your birth control options while taking this medicine. This drug is banned from use in athletes by most athletic organizations. What side effects may I notice from receiving this medicine? Side effects that you should report to  your doctor or health care professional as soon as possible:  allergic reactions like skin rash, itching or hives, swelling of the face, lips, or tongue  breast enlargement  breathing problems  changes in emotions or  moods  deep or hoarse voice  irregular menstrual periods  signs and symptoms of liver injury like dark yellow or brown urine; general ill feeling or flu-like symptoms; light-colored stools; loss of appetite; nausea; right upper belly pain; unusually weak or tired; yellowing of the eyes or skin  stomach pain  swelling of the ankles, feet, hands  too frequent or persistent erections  trouble passing urine or change in the amount of urine Side effects that usually do not require medical attention (report to your doctor or health care professional if they continue or are bothersome):  acne  change in sex drive or performance  facial hair growth  hair loss  headache This list may not describe all possible side effects. Call your doctor for medical advice about side effects. You may report side effects to FDA at 1-800-FDA-1088. Where should I keep my medicine? Keep out of the reach of children. This medicine can be abused. Keep your medicine in a safe place to protect it from theft. Do not share this medicine with anyone. Selling or giving away this medicine is dangerous and against the law. Store at room temperature between 20 and 25 degrees C (68 and 77 degrees F). Do not freeze. Protect from light. Follow the directions for the product you are prescribed. Throw away any unused medicine after the expiration date. NOTE: This sheet is a summary. It may not cover all possible information. If you have questions about this medicine, talk to your doctor, pharmacist, or health care provider.  2020 Elsevier/Gold Standard (2015-05-09 07:33:55)  

## 2019-07-16 NOTE — Progress Notes (Signed)
Pt given testosterone injection 200mg   IM LUOQ and tolerated well

## 2019-07-19 ENCOUNTER — Ambulatory Visit: Payer: Self-pay | Admitting: Nurse Practitioner

## 2019-07-22 ENCOUNTER — Other Ambulatory Visit: Payer: Self-pay

## 2019-07-22 ENCOUNTER — Ambulatory Visit (INDEPENDENT_AMBULATORY_CARE_PROVIDER_SITE_OTHER): Payer: 59 | Admitting: Family Medicine

## 2019-07-22 ENCOUNTER — Ambulatory Visit: Payer: 59 | Admitting: Family Medicine

## 2019-07-22 ENCOUNTER — Encounter: Payer: Self-pay | Admitting: Family Medicine

## 2019-07-22 VITALS — BP 138/80 | HR 73 | Temp 99.1°F | Ht 64.0 in | Wt 163.0 lb

## 2019-07-22 DIAGNOSIS — I10 Essential (primary) hypertension: Secondary | ICD-10-CM | POA: Diagnosis not present

## 2019-07-22 DIAGNOSIS — Z79899 Other long term (current) drug therapy: Secondary | ICD-10-CM | POA: Diagnosis not present

## 2019-07-22 DIAGNOSIS — L570 Actinic keratosis: Secondary | ICD-10-CM

## 2019-07-22 DIAGNOSIS — E349 Endocrine disorder, unspecified: Secondary | ICD-10-CM | POA: Diagnosis not present

## 2019-07-22 DIAGNOSIS — N5201 Erectile dysfunction due to arterial insufficiency: Secondary | ICD-10-CM

## 2019-07-22 MED ORDER — SILDENAFIL CITRATE 100 MG PO TABS: 50.0000 mg | ORAL_TABLET | Freq: Every day | ORAL | 11 refills | Status: DC | PRN

## 2019-07-22 NOTE — Patient Instructions (Signed)
Cryosurgery for Skin Conditions, Care After This sheet gives you information about how to care for yourself after your procedure. Your health care provider may also give you more specific instructions. If you have problems or questions, contact your health care provider. What can I expect after the procedure? After your procedure, it is common to have redness, swelling, and a blister that forms over the treated area. The blister may contain a small amount of blood. After about 2 weeks, the blister will break on its own, leaving a scab. Then the treated area will heal. After healing, there is usually little or no scarring. Follow these instructions at home: Caring for the treated area   Follow instructions from your health care provider about how to take care of the treated area. Make sure you: ? Keep the area covered with a bandage (dressing) until it heals, or for as long as told by your health care provider. ? Wash your hands with soap and water before you change your dressing. If soap and water are not available, use hand sanitizer. ? Change your dressing as told by your health care provider. ? Keep the dressing and the treated area clean and dry. If the dressing gets wet, change it right away. ? Clean the treated area with soap and water.  Check the treated area every day for signs of infection. Check for: ? More redness, swelling, or pain. ? More fluid or blood. ? Warmth. ? Pus or a bad smell. General instructions  Do not pick at your blister or try to break it open. This can cause infection and scarring.  Do not apply any medicine, cream, or lotion to the treated area unless directed by your health care provider.  Take over-the-counter and prescription medicines only as told by your health care provider.  Keep all follow-up visits as told by your health care provider. This is important. Contact a health care provider if:  You have more redness, swelling, or pain around the  treated area.  You have more fluid or blood coming from the treated area.  The treated area feels warm to the touch.  You have pus or a bad smell coming from the treated area.  Your blister becomes large and painful. Get help right away if:  You have a fever and have redness spreading from the treated area. Summary  The treated area will become red and swollen shortly after the procedure.  You should keep the treated area and your dressing clean and dry.  Check the treated area every day for signs of infection, such as fluid, pus, warmth, or having more redness, swelling, or pain.  Do not pick at your blister or try to break it open. This information is not intended to replace advice given to you by your health care provider. Make sure you discuss any questions you have with your health care provider. Document Revised: 03/17/2017 Document Reviewed: 02/22/2016 Elsevier Patient Education  2020 [employer].

## 2019-07-22 NOTE — Progress Notes (Signed)
Subjective: CC: Establish care, testosterone deficiency, hypertension, erectile dysfunction PCP: Jorge Norlander, DO [id number] Jorge [name] is Jorge 59 y.o. male presenting to clinic today for:  1.  Testosterone deficiency Patient reports this was diagnosed several years ago.  He was initially treated with topical testosterone which was not found to be very effective.  He was ultimately transitioned over to the injectable.  He finds that this is more helpful.  His energy and function is better.  He does continue to struggle with erectile dysfunction.  He is able to achieve an erection but is not able to maintain.  He has never been on any type of ED medication but is interested in starting 1.  2.  Hypertension Patient reports that he has been treated with atenolol 25 mg daily for blood pressure.  He was becoming somewhat bradycardic with heart palpitations and this was reduced to 12.5 milligrams daily recently.  He has been doing fine since that reduction.  Reports ED as above.  No change in physical activity or dyspnea on exertion.  No chest pain.  3.  Ear lesion Patient reports he has had Jorge lesion on the left ear.  This was initially thought to be Jorge blackhead and it was attempted to be drained at Jorge previous visit.  He notes that it has recurred and he is wondering if maybe Jorge different treatment needs to be [name].  Denies any pain or discharge.   ROS: Per HPI  Allergies  Allergen Reactions  . Penicillins Rash   Past Medical History:  Diagnosis Date  . Abnormal liver enzymes   . Duodenitis   . Hemorrhoids   . Hiatal hernia   . Hyperlipidemia   . Hypertension   . Kidney stones   . Testosterone deficiency   . Tubular adenoma of colon     Current Outpatient Medications:  .  atenolol (TENORMIN) 25 MG tablet, Take 1 tablet (25 mg total) by mouth daily., Disp: 90 tablet, Rfl: 3 .  cholecalciferol (VITAMIN D) 1000 UNITS tablet, Take 2,000 Units by mouth daily.  , Disp: , Rfl:  .   Multiple Vitamins-Minerals (MULTIVITAMIN ADULT PO), Take by mouth., Disp: , Rfl:  .  omeprazole (PRILOSEC OTC) 20 MG tablet, Take 1 tablet (20 mg total) by mouth daily., Disp: 90 tablet, Rfl: 1 .  rosuvastatin (CRESTOR) 40 MG tablet, TAKE ONE (1) TABLET EACH DAY, Disp: 180 tablet, Rfl: 1 .  testosterone cypionate (DEPOTESTOSTERONE CYPIONATE) 200 MG/ML injection, Inject 1 mL (200 mg total) into the muscle every 28 (twenty-eight) days., Disp: 10 mL, Rfl: 2 .  vitamin C (ASCORBIC ACID) 500 MG tablet, Take 500 mg by mouth daily., Disp: , Rfl:   Current Facility-Administered Medications:  .  testosterone cypionate (DEPOTESTOSTERONE CYPIONATE) injection 200 mg, 200 mg, Intramuscular, Q28 days, Jorge [name], [name], FNP, 200 mg at 07/16/19 1534 Social History   Socioeconomic History  . Marital status: Married    Spouse name: Not on file  . Number of children: 1  . Years of education: Not on file  . Highest education level: Not on file  Occupational History  . Occupation: Management  Tobacco Use  . Smoking status: Never Smoker  . Smokeless tobacco: Never Used  Substance and Sexual Activity  . Alcohol use: No  . Drug use: No  . Sexual activity: Not on file  Other Topics Concern  . Not on file  Social History Narrative  . Not on file   Social Determinants of Health  Financial Resource Strain:   . Difficulty of Paying Living Expenses:   Food Insecurity:   . Worried About Charity fundraiser in the Last Year:   . Arboriculturist in the Last Year:   Transportation Needs:   . Film/video editor (Medical):   [name] Kitchen Lack of Transportation (Non-Medical):   Physical Activity:   . Days of Exercise per Week:   . Minutes of Exercise per Session:   Stress:   . Feeling of Stress :   Social Connections:   . Frequency of Communication with Friends and Family:   . Frequency of Social Gatherings with Friends and Family:   . Attends Religious Services:   . Active Member of Clubs or  Organizations:   . Attends Archivist Meetings:   [name] Kitchen Marital Status:   Intimate Partner Violence:   . Fear of Current or Ex-Partner:   . Emotionally Abused:   [name] Kitchen Physically Abused:   . Sexually Abused:    Family History  Problem Relation Age of Onset  . Heart disease Mother 48       Heart Valve Disease  . Hypertension Mother   . Diabetes Father   . Colon cancer Father     Objective: Office vital signs reviewed. BP 138/80 Comment: manual  Pulse 73   Temp 99.1 F (37.3 C) (Temporal)   Ht 5\' 4"  (1.626 m)   Wt 163 lb (73.9 kg)   BMI 27.98 kg/m   Physical Examination:  General: Awake, alert, well nourished, No acute distress HEENT: Normal, sclera white, MMM Cardio: regular rate and rhythm, S1S2 heard, no murmurs appreciated Pulm: clear to auscultation bilaterally, no wheezes, rhonchi or rales; normal work of breathing on room air Extremities: warm, well perfused, No edema, cyanosis or clubbing; +2 pulses bilaterally MSK: normla gait and station Skin: Hyperkeratotic nodular lesion noted along the left helix.  No induration, warmth or redness.  Cryotherapy Procedure:  Risks and benefits of procedure were reviewed with the patient.  Written consent obtained and scanned into the chart.  Lesion of concern was identified and located on left helix of ear.  Liquid nitrogen was applied to area of concern and extending out 1 millimeters beyond the border of the lesion.  Treated area was allowed to come back to room temperature before treating it Jorge second time.  Patient tolerated procedure well and there were no immediate complications.  Home care instructions were reviewed with the patient and Jorge handout was provided.   Assessment/ Plan: 59 y.o. male   1. Testosterone deficiency Plan for testosterone and CBC in about 1/2 weeks, this will be about 2 weeks after administration of testosterone IM here in office - PSA; Future - CBC with Differential; Future - Testosterone,Free  and Total; Future  2. Essential hypertension Well-controlled after manual recheck.  Continue current regimen.  We discussed possibility of tapering off of beta-blocker going forward if he becomes bradycardic or starts having intolerance to the medicine  3. Erectile dysfunction due to arterial insufficiency Trial of Viagra.  We discussed various options. - sildenafil (VIAGRA) 100 MG tablet; Take 0.5-1 tablets (50-100 mg total) by mouth daily as needed for erectile dysfunction.  Dispense: 5 tablet; Refill: 11 - PSA; Future  4. Controlled substance agreement signed For testosterone.  UDS obtained per office policy. - ToxASSURE Select 13 (MW), Urine  5. Actinic keratosis Treated with cryotherapy here in office.  Patient tolerated procedure without difficulty.  Follow-up as needed   No orders  of the defined types were placed in this encounter.  No orders of the defined types were placed in this encounter.    Jorge Norlander, DO [place] [phone]

## 2019-07-24 LAB — TOXASSURE SELECT 13 (MW), URINE

## 2019-07-30 ENCOUNTER — Ambulatory Visit: Payer: Self-pay | Attending: Internal Medicine

## 2019-07-30 DIAGNOSIS — Z23 Encounter for immunization: Secondary | ICD-10-CM

## 2019-07-30 NOTE — Progress Notes (Signed)
   Covid-19 Vaccination Clinic  Name:  Eh Waver    MRN: [id number] DOB: [birth year]  07/30/2019  Mr. Balles was observed post Covid-19 immunization for 15 minutes without incident. He was provided with Vaccine Information Sheet and instruction to access the V-Safe system.   Mr. Basaldua was instructed to call 911 with any severe reactions post vaccine: [name] Kitchen Difficulty breathing  . Swelling of face and throat  . A fast heartbeat  . A bad rash all over body  . Dizziness and weakness   Immunizations Administered    Name Date Dose VIS Date Route   Pfizer COVID-19 Vaccine 07/30/2019  8:38 AM 0.3 mL 03/29/2019 Intramuscular   Manufacturer: [place]   Lot: [id number]   [place]: [id number]

## 2019-07-31 ENCOUNTER — Other Ambulatory Visit: Payer: 59

## 2019-07-31 ENCOUNTER — Telehealth: Payer: Self-pay | Admitting: Family Medicine

## 2019-07-31 ENCOUNTER — Other Ambulatory Visit: Payer: Self-pay

## 2019-07-31 DIAGNOSIS — E349 Endocrine disorder, unspecified: Secondary | ICD-10-CM

## 2019-07-31 DIAGNOSIS — E7800 Pure hypercholesterolemia, unspecified: Secondary | ICD-10-CM

## 2019-07-31 DIAGNOSIS — N5201 Erectile dysfunction due to arterial insufficiency: Secondary | ICD-10-CM

## 2019-07-31 MED ORDER — ROSUVASTATIN CALCIUM 40 MG PO TABS: ORAL_TABLET | ORAL | 1 refills | Status: DC

## 2019-07-31 NOTE — Telephone Encounter (Signed)
°  Prescription Request  07/31/2019  What is the name of the medication or equipment? Crestor  Have you contacted your pharmacy to request a refill? (if applicable) Yes  Which pharmacy would you like this sent to? [facility] requesting refill on his Rx. Had appt with Dr Darnell Level on 07/22/19.   Patient notified that their request is being sent to the clinical staff for review and that they should receive a response within 2 business days.

## 2019-08-01 LAB — CBC WITH DIFFERENTIAL/PLATELET
Basophils Absolute: 0 10*3/uL (ref 0.0–0.2)
Basos: 1 %
EOS (ABSOLUTE): 0.1 10*3/uL (ref 0.0–0.4)
Eos: 1 %
Hematocrit: 51.9 % — ABNORMAL HIGH (ref 37.5–51.0)
Hemoglobin: 17.6 g/dL (ref 13.0–17.7)
Immature Grans (Abs): 0 10*3/uL (ref 0.0–0.1)
Immature Granulocytes: 0 %
Lymphocytes Absolute: 1.6 10*3/uL (ref 0.7–3.1)
Lymphs: 19 %
MCH: 31.7 pg (ref 26.6–33.0)
MCHC: 33.9 g/dL (ref 31.5–35.7)
MCV: 94 fL (ref 79–97)
Monocytes Absolute: 0.9 10*3/uL (ref 0.1–0.9)
Monocytes: 10 %
Neutrophils Absolute: 5.9 10*3/uL (ref 1.4–7.0)
Neutrophils: 69 %
Platelets: 175 10*3/uL (ref 150–450)
RBC: 5.55 x10E6/uL (ref 4.14–5.80)
RDW: 12.9 % (ref 11.6–15.4)
WBC: 8.6 10*3/uL (ref 3.4–10.8)

## 2019-08-01 LAB — PSA: Prostate Specific Ag, Serum: 2 ng/mL (ref 0.0–4.0)

## 2019-08-01 LAB — TESTOSTERONE,FREE AND TOTAL
Testosterone, Free: 12.7 pg/mL (ref 7.2–24.0)
Testosterone: 240 ng/dL — ABNORMAL LOW (ref 264–916)

## 2019-08-13 ENCOUNTER — Other Ambulatory Visit: Payer: Self-pay

## 2019-08-13 ENCOUNTER — Ambulatory Visit (INDEPENDENT_AMBULATORY_CARE_PROVIDER_SITE_OTHER): Payer: 59 | Admitting: *Deleted

## 2019-08-13 DIAGNOSIS — E349 Endocrine disorder, unspecified: Secondary | ICD-10-CM | POA: Diagnosis not present

## 2019-08-13 NOTE — Progress Notes (Signed)
Pt given testosterone injection 200mg  IM RUOQ and tolerated well.

## 2019-09-10 ENCOUNTER — Ambulatory Visit (INDEPENDENT_AMBULATORY_CARE_PROVIDER_SITE_OTHER): Payer: 59

## 2019-09-10 ENCOUNTER — Other Ambulatory Visit: Payer: Self-pay

## 2019-09-10 DIAGNOSIS — E349 Endocrine disorder, unspecified: Secondary | ICD-10-CM | POA: Diagnosis not present

## 2019-09-10 NOTE — Progress Notes (Signed)
Testosterone injection given to left upper outer quadrant.  Patient tolerated well. 

## 2019-10-11 ENCOUNTER — Other Ambulatory Visit: Payer: Self-pay

## 2019-10-11 ENCOUNTER — Ambulatory Visit (INDEPENDENT_AMBULATORY_CARE_PROVIDER_SITE_OTHER): Payer: 59

## 2019-10-11 DIAGNOSIS — E349 Endocrine disorder, unspecified: Secondary | ICD-10-CM

## 2019-10-11 NOTE — Progress Notes (Signed)
88ml testosterone injection given without difficulty in right upper outer quadrant. Next appt scheduled for 11/11/19 at 3:30pm. Pt advised to call pharmacy for another refill.

## 2019-11-04 ENCOUNTER — Other Ambulatory Visit: Payer: Self-pay | Admitting: Nurse Practitioner

## 2019-11-04 DIAGNOSIS — E349 Endocrine disorder, unspecified: Secondary | ICD-10-CM

## 2019-11-05 ENCOUNTER — Other Ambulatory Visit: Payer: Self-pay | Admitting: Family Medicine

## 2019-11-05 DIAGNOSIS — E349 Endocrine disorder, unspecified: Secondary | ICD-10-CM

## 2019-11-05 MED ORDER — TESTOSTERONE CYPIONATE 200 MG/ML IM SOLN: 200.0000 mg | INTRAMUSCULAR | 1 refills | Status: DC

## 2019-11-05 NOTE — Telephone Encounter (Signed)
Last OV 07/22/19. Last RF 01/18/2019. Next OV not scheduled  Ok to refill or does pt ntbs

## 2019-11-05 NOTE — Telephone Encounter (Signed)
I can send.  He signed [place] last visit in April.  I will place order for labs (if he wants to come in one morning) and refill med.  Needs to be seen in 3 months in office plan for am testosterone with labs.

## 2019-11-07 NOTE — Telephone Encounter (Signed)
Patient aware and verbalizes understanding. 

## 2019-11-11 ENCOUNTER — Other Ambulatory Visit: Payer: Self-pay

## 2019-11-11 ENCOUNTER — Ambulatory Visit (INDEPENDENT_AMBULATORY_CARE_PROVIDER_SITE_OTHER): Payer: 59 | Admitting: *Deleted

## 2019-11-11 DIAGNOSIS — E349 Endocrine disorder, unspecified: Secondary | ICD-10-CM

## 2019-11-19 ENCOUNTER — Encounter: Payer: Self-pay | Admitting: Family Medicine

## 2019-11-19 ENCOUNTER — Ambulatory Visit (INDEPENDENT_AMBULATORY_CARE_PROVIDER_SITE_OTHER): Payer: 59 | Admitting: Family Medicine

## 2019-11-19 ENCOUNTER — Other Ambulatory Visit: Payer: Self-pay

## 2019-11-19 VITALS — BP 135/75 | HR 82 | Temp 98.5°F | Ht 64.0 in | Wt 163.2 lb

## 2019-11-19 DIAGNOSIS — L509 Urticaria, unspecified: Secondary | ICD-10-CM | POA: Diagnosis not present

## 2019-11-19 MED ORDER — LEVOCETIRIZINE DIHYDROCHLORIDE 5 MG PO TABS: 5.0000 mg | ORAL_TABLET | Freq: Every evening | ORAL | 2 refills | Status: DC

## 2019-11-19 NOTE — Patient Instructions (Signed)
Hives Hives are itchy, red, swollen areas on your skin. Hives can show up on any part of your body. Hives often fade within 24 hours (acute hives). New hives can show up after old ones fade. This can go on for many days or weeks (chronic hives). Hives do not spread from person to person (are not contagious). Hives are caused by your body's response to something that you are allergic to (allergen). These are sometimes called triggers. You can get hives right after being around a trigger, or hours later. What are the causes?  Allergies to foods.  Insect bites or stings.  Pollen.  Pets.  Latex.  Chemicals.  Spending time in sunlight, heat, or cold.  Exercise.  Stress.  Some medicines.  Viruses. This includes the common cold.  Infections caused by germs (bacteria).  Allergy shots.  Blood transfusions. Sometimes, the cause is not known. What increases the risk?  Being a woman.  Being allergic to foods such as: ? Citrus fruits. ? Milk. ? Eggs. ? Peanuts. ? Tree nuts. ? Shellfish.  Being allergic to: ? Medicines. ? Latex. ? Insects. ? Animals. ? Pollen. What are the signs or symptoms?   Raised, itchy, red or white bumps or patches on your skin. These areas may: ? Get large and swollen. ? Change in shape and location. ? Stand alone or connect to each other over a large area of skin. ? Sting or hurt. ? Turn white when pressed in the center (blanch). In very bad cases, your hands, feet, and face may also get swollen. This may happen if hives start deeper in your skin. How is this treated? Treatment for this condition depends on your symptoms. Treatment may include:  Using cool, wet cloths (cool compresses) or taking cool showers to stop the itching.  Medicines that help: ? Relieve itching (antihistamines). ? Reduce swelling (corticosteroids). ? Treat infection (antibiotics).  A medicine (omalizumab) that is given as a shot (injection). Your doctor may  prescribe this if you have hives that do not get better even after other treatments.  In very bad cases, you may need a shot of a medicine called epinephrineto prevent a life-threatening allergic reaction (anaphylaxis). Follow these instructions at home: Medicines  Take or apply over-the-counter and prescription medicines only as told by your doctor.  If you were prescribed an antibiotic medicine, use it as told by your doctor. Do not stop using it even if you start to feel better. Skin care  Apply cool, wet cloths to the hives.  Do not scratch your skin. Do not rub your skin. General instructions  Do not take hot showers or baths. This can make itching worse.  Do not wear tight clothes.  Use sunscreen and wear clothes that cover your skin when you are outside.  Avoid any triggers that cause your hives. Keep a journal to help track what causes your hives. Write down: ? What medicines you take. ? What you eat and drink. ? What products you use on your skin.  Keep all follow-up visits as told by your doctor. This is important. Contact a doctor if:  Your symptoms are not better with medicine.  Your joints hurt or are swollen. Get help right away if:  You have a fever.  You have pain in your belly (abdomen).  Your tongue or lips are swollen.  Your eyelids are swollen.  Your chest or throat feels tight.  You have trouble breathing or swallowing. These symptoms may be an emergency.   Do not wait to see if the symptoms will go away. Get medical help right away. Call your local emergency services (911 in the [place] Do not drive yourself to the hospital. Summary  Hives are itchy, red, swollen areas on your skin.  Treatment for this condition depends on your symptoms.  Avoid things that cause your hives. Keep a journal to help track what causes your hives.  Take and apply over-the-counter and prescription medicines only as told by your doctor.  Keep all follow-up visits  as told by your doctor. This is important. This information is not intended to replace advice given to you by your health care provider. Make sure you discuss any questions you have with your health care provider. Document Revised: 10/18/2017 Document Reviewed: 10/18/2017 Elsevier Patient Education  2020 [employer]  

## 2019-11-19 NOTE — Progress Notes (Signed)
Assessment & Plan:  1. Urticaria - Education provided on hives. Patient to start taking Xyzal QHS. Continue hydrocortisone as needed. Testing for alpha-gal to rule this out.  - Alpha-Gal Panel - levocetirizine (XYZAL) 5 MG tablet; Take 1 tablet (5 mg total) by mouth every evening.  Dispense: 30 tablet; Refill: 2   Follow up plan: Return if symptoms worsen or fail to improve.  Hendricks Limes, MSN, APRN, FNP-C [facility]  Subjective:   Patient ID: Azarius Lambson, male    DOB: 1961/01/14, 59 y.o.   MRN: [id number]  HPI: Paul Trettin is a 59 y.o. male presenting on 11/19/2019 for Rash (Patient states he gets a rash on and off on his legs and arms that have been going on since Saturday.)  Patient reports an itchy rash that comes and goes to his arms and legs.  This has been ongoing but his most recent flareup started 3 days ago.  He reports the rash always comes in the middle of the night and resolves in the morning when he applies hydrocortisone cream.  Rash was present this morning when patient woke up.  He had a BLT for dinner last night.  He does report on Saturday he was been talking during the day and all of this started flaring up that night.  He does not currently have a rash since he applied hydrocortisone cream this morning but he does have pictures of the rash on his phone and it is consistent with hives.   ROS: Negative unless specifically indicated above in HPI.   Relevant past medical history reviewed and updated as indicated.   Allergies and medications reviewed and updated.   Current Outpatient Medications:  .  atenolol (TENORMIN) 25 MG tablet, Take 1 tablet (25 mg total) by mouth daily. (Patient taking differently: Take 12.5 mg by mouth daily. ), Disp: 90 tablet, Rfl: 3 .  cholecalciferol (VITAMIN D) 1000 UNITS tablet, Take 2,000 Units by mouth daily.  , Disp: , Rfl:  .  Multiple Vitamins-Minerals (MULTIVITAMIN ADULT PO), Take by  mouth., Disp: , Rfl:  .  omeprazole (PRILOSEC OTC) 20 MG tablet, Take 1 tablet (20 mg total) by mouth daily., Disp: 90 tablet, Rfl: 1 .  rosuvastatin (CRESTOR) 40 MG tablet, TAKE ONE (1) TABLET EACH DAY, Disp: 180 tablet, Rfl: 1 .  sildenafil (VIAGRA) 100 MG tablet, Take 0.5-1 tablets (50-100 mg total) by mouth daily as needed for erectile dysfunction., Disp: 5 tablet, Rfl: 11 .  testosterone cypionate (DEPOTESTOSTERONE CYPIONATE) 200 MG/ML injection, Inject 1 mL (200 mg total) into the muscle every 28 (twenty-eight) days., Disp: 10 mL, Rfl: 1 .  vitamin C (ASCORBIC ACID) 500 MG tablet, Take 500 mg by mouth daily., Disp: , Rfl:   Current Facility-Administered Medications:  .  testosterone cypionate (DEPOTESTOSTERONE CYPIONATE) injection 200 mg, 200 mg, Intramuscular, Q28 days, Hassell Done, [name], FNP, 200 mg at 11/11/19 1530  Allergies  Allergen Reactions  . Penicillins Rash    Objective:   BP 135/75   Pulse 82   Temp 98.5 F (36.9 C) (Temporal)   Ht 5\' 4"  (1.626 m)   Wt 163 lb 3.2 oz (74 kg)   SpO2 96%   BMI 28.01 kg/m    Physical Exam Vitals reviewed.  Constitutional:      General: He is not in acute distress.    Appearance: Normal appearance. He is not ill-appearing, toxic-appearing or diaphoretic.  HENT:     Head: Normocephalic and atraumatic.  Eyes:  General: No scleral icterus.       Right eye: No discharge.        Left eye: No discharge.     Conjunctiva/sclera: Conjunctivae normal.  Cardiovascular:     Rate and Rhythm: Normal rate.  Pulmonary:     Effort: Pulmonary effort is normal. No respiratory distress.  Musculoskeletal:        General: Normal range of motion.     Cervical back: Normal range of motion.  Skin:    General: Skin is warm and dry.  Neurological:     Mental Status: He is alert and oriented to person, place, and time. Mental status is at baseline.  Psychiatric:        Mood and Affect: Mood normal.        Behavior: Behavior normal.         Thought Content: Thought content normal.        Judgment: Judgment normal.

## 2019-11-25 LAB — ALPHA-GAL PANEL
Alpha Gal IgE*: <0.1 kU/L (ref <0.10)
Beef (Bos spp) IgE: <0.1 kU/L (ref <0.35)
Class Interpretation: 0
Class Interpretation: 0
Class Interpretation: 0
Lamb/Mutton (Ovis spp) IgE: <0.1 kU/L (ref <0.35)
Pork (Sus spp) IgE: <0.1 kU/L (ref <0.35)

## 2019-12-13 ENCOUNTER — Ambulatory Visit (INDEPENDENT_AMBULATORY_CARE_PROVIDER_SITE_OTHER): Payer: 59 | Admitting: *Deleted

## 2019-12-13 ENCOUNTER — Other Ambulatory Visit: Payer: Self-pay

## 2019-12-13 DIAGNOSIS — E349 Endocrine disorder, unspecified: Secondary | ICD-10-CM | POA: Diagnosis not present

## 2019-12-13 NOTE — Progress Notes (Signed)
Testosterone Injection given and tolerated well  

## 2020-01-13 ENCOUNTER — Ambulatory Visit (INDEPENDENT_AMBULATORY_CARE_PROVIDER_SITE_OTHER): Payer: 59

## 2020-01-13 ENCOUNTER — Other Ambulatory Visit: Payer: Self-pay

## 2020-01-13 DIAGNOSIS — E349 Endocrine disorder, unspecified: Secondary | ICD-10-CM

## 2020-01-13 NOTE — Progress Notes (Signed)
Testosterone injection given to left upper outer quadrant.  Patient tolerated well. 

## 2020-02-04 ENCOUNTER — Ambulatory Visit (INDEPENDENT_AMBULATORY_CARE_PROVIDER_SITE_OTHER): Payer: 59 | Admitting: Family Medicine

## 2020-02-04 ENCOUNTER — Other Ambulatory Visit: Payer: Self-pay

## 2020-02-04 ENCOUNTER — Encounter: Payer: Self-pay | Admitting: Family Medicine

## 2020-02-04 VITALS — BP 152/81 | HR 96 | Temp 98.2°F | Ht 64.0 in | Wt 164.0 lb

## 2020-02-04 DIAGNOSIS — N5201 Erectile dysfunction due to arterial insufficiency: Secondary | ICD-10-CM

## 2020-02-04 DIAGNOSIS — E349 Endocrine disorder, unspecified: Secondary | ICD-10-CM | POA: Diagnosis not present

## 2020-02-04 DIAGNOSIS — E7800 Pure hypercholesterolemia, unspecified: Secondary | ICD-10-CM | POA: Diagnosis not present

## 2020-02-04 DIAGNOSIS — I10 Essential (primary) hypertension: Secondary | ICD-10-CM

## 2020-02-04 MED ORDER — ROSUVASTATIN CALCIUM 40 MG PO TABS: ORAL_TABLET | ORAL | 1 refills | Status: DC

## 2020-02-04 MED ORDER — SILDENAFIL CITRATE 100 MG PO TABS: 50.0000 mg | ORAL_TABLET | Freq: Every day | ORAL | 11 refills | Status: DC | PRN

## 2020-02-04 NOTE — Patient Instructions (Signed)

## 2020-02-04 NOTE — Progress Notes (Signed)
Subjective: CC: f/u testosterone deficiency, hypertension, erectile dysfunction PCP: [name], DO OIZ:TIWPYK Jorge [name] is a 59 y.o. male presenting to clinic today for:  1.  Testosterone deficiency/erectile dysfunction Patient reports this was diagnosed several years ago.  He was initially treated with topical testosterone which was not found to be very effective.  He was ultimately transitioned over to the injectable.   He continues to have good energy.  Denies any gynecomastia or testicular atrophy.  Last dose of testosterone was around 28 September.  He reports that the Viagra is very helpful and he would like to continue refilling this.  2.  Hypertension with hyperlipidemia Patient reports compliance with atenolol and Crestor.  No chest pain, shortness of breath.  Blood pressures at home are typically running 998P to 382N systolics over 05L to 97Q diastolics.  Being at the office gives him some anxiety and he thinks this is why his blood pressure is elevated.  He is fasting for labs today  ROS: Per HPI  Allergies  Allergen Reactions  . Penicillins Rash   Past Medical History:  Diagnosis Date  . Abnormal liver enzymes   . Duodenitis   . Hemorrhoids   . Hiatal hernia   . Hyperlipidemia   . Hypertension   . Kidney stones   . Testosterone deficiency   . Tubular adenoma of colon     Current Outpatient Medications:  .  atenolol (TENORMIN) 25 MG tablet, Take 1 tablet (25 mg total) by mouth daily. (Patient taking differently: Take 12.5 mg by mouth daily. ), Disp: 90 tablet, Rfl: 3 .  cholecalciferol (VITAMIN D) 1000 UNITS tablet, Take 2,000 Units by mouth daily.  , Disp: , Rfl:  .  levocetirizine (XYZAL) 5 MG tablet, Take 1 tablet (5 mg total) by mouth every evening., Disp: 30 tablet, Rfl: 2 .  Multiple Vitamins-Minerals (MULTIVITAMIN ADULT PO), Take by mouth., Disp: , Rfl:  .  omeprazole (PRILOSEC OTC) 20 MG tablet, Take 1 tablet (20 mg total) by mouth daily.,  Disp: 90 tablet, Rfl: 1 .  rosuvastatin (CRESTOR) 40 MG tablet, TAKE ONE (1) TABLET EACH DAY, Disp: 180 tablet, Rfl: 1 .  sildenafil (VIAGRA) 100 MG tablet, Take 0.5-1 tablets (50-100 mg total) by mouth daily as needed for erectile dysfunction., Disp: 5 tablet, Rfl: 11 .  testosterone cypionate (DEPOTESTOSTERONE CYPIONATE) 200 MG/ML injection, Inject 1 mL (200 mg total) into the muscle every 28 (twenty-eight) days., Disp: 10 mL, Rfl: 1 .  vitamin C (ASCORBIC ACID) 500 MG tablet, Take 500 mg by mouth daily., Disp: , Rfl:   Current Facility-Administered Medications:  .  testosterone cypionate (DEPOTESTOSTERONE CYPIONATE) injection 200 mg, 200 mg, Intramuscular, Q28 days, Hassell Done, [name], FNP, 200 mg at 01/13/20 1607 Social History   Socioeconomic History  . Marital status: Married    Spouse name: Not on file  . Number of children: 1  . Years of education: Not on file  . Highest education level: Not on file  Occupational History  . Occupation: Management  Tobacco Use  . Smoking status: Never Smoker  . Smokeless tobacco: Never Used  Substance and Sexual Activity  . Alcohol use: No  . Drug use: No  . Sexual activity: Not on file  Other Topics Concern  . Not on file  Social History Narrative  . Not on file   Social Determinants of Health   Financial Resource Strain:   . Difficulty of Paying Living Expenses: Not on file  Food Insecurity:   .  Worried About Charity fundraiser in the Last Year: Not on file  . Ran Out of Food in the Last Year: Not on file  Transportation Needs:   . Lack of Transportation (Medical): Not on file  . Lack of Transportation (Non-Medical): Not on file  Physical Activity:   . Days of Exercise per Week: Not on file  . Minutes of Exercise per Session: Not on file  Stress:   . Feeling of Stress : Not on file  Social Connections:   . Frequency of Communication with Friends and Family: Not on file  . Frequency of Social Gatherings with Friends and  Family: Not on file  . Attends Religious Services: Not on file  . Active Member of Clubs or Organizations: Not on file  . Attends Archivist Meetings: Not on file  . Marital Status: Not on file  Intimate Partner Violence:   . Fear of Current or Ex-Partner: Not on file  . Emotionally Abused: Not on file  . Physically Abused: Not on file  . Sexually Abused: Not on file   Family History  Problem Relation Age of Onset  . Heart disease Mother 63       Heart Valve Disease  . Hypertension Mother   . Diabetes Father   . Colon cancer Father     Objective: Office vital signs reviewed. BP (!) 152/81   Pulse 96   Temp 98.2 F (36.8 C) (Temporal)   Ht '5\' 4"'  (1.626 m)   Wt 164 lb (74.4 kg)   SpO2 96%   BMI 28.15 kg/m   Physical Examination:  General: Awake, alert, well nourished, No acute distress HEENT: Normal, sclera white, MMM, no carotid bruits Cardio: regular rate and rhythm, S1S2 heard, no murmurs appreciated Pulm: clear to auscultation bilaterally, no wheezes, rhonchi or rales; normal work of breathing on room air Extremities: warm, well perfused, No edema, cyanosis or clubbing; +2 pulses bilaterally  Assessment/ Plan: 59 y.o. male   1. Testosterone deficiency Check CBC, testosterone.  PSA was normal in April. - CBC - Testosterone,Free and Total  2. Pure hypercholesterolemia Check fasting cholesterol, metabolic panel - TMA26+JFHL - Lipid Panel - TSH - rosuvastatin (CRESTOR) 40 MG tablet; TAKE ONE (1) TABLET EACH DAY  Dispense: 180 tablet; Refill: 1  3. Essential hypertension Blood pressure is not controlled but I wonder if this is situational.  He will come in for his Covid booster shot soon and I recommended that his blood pressure be rechecked at that visit - CMP14+EGFR  4. Erectile dysfunction due to arterial insufficiency Responding well to Viagra.  Refill sent - sildenafil (VIAGRA) 100 MG tablet; Take 0.5-1 tablets (50-100 mg total) by mouth daily  as needed for erectile dysfunction.  Dispense: 5 tablet; Refill: 11   No orders of the defined types were placed in this encounter.  No orders of the defined types were placed in this encounter.    [name], DO [place] [phone]

## 2020-02-06 LAB — CMP14+EGFR
ALT: 35 IU/L (ref 0–44)
AST: 21 IU/L (ref 0–40)
Albumin/Globulin Ratio: 1.9 (ref 1.2–2.2)
Albumin: 4.6 g/dL (ref 3.8–4.9)
Alkaline Phosphatase: 68 IU/L (ref 44–121)
BUN/Creatinine Ratio: 11 (ref 9–20)
BUN: 11 mg/dL (ref 6–24)
Bilirubin Total: 0.4 mg/dL (ref 0.0–1.2)
CO2: 26 mmol/L (ref 20–29)
Calcium: 9.5 mg/dL (ref 8.7–10.2)
Chloride: 104 mmol/L (ref 96–106)
Creatinine, Ser: 1.04 mg/dL (ref 0.76–1.27)
GFR calc Af Amer: 92 mL/min/{1.73_m2} (ref >59)
GFR calc non Af Amer: 79 mL/min/{1.73_m2} (ref >59)
Globulin, Total: 2.4 g/dL (ref 1.5–4.5)
Glucose: 110 mg/dL — ABNORMAL HIGH (ref 65–99)
Potassium: 3.9 mmol/L (ref 3.5–5.2)
Sodium: 142 mmol/L (ref 134–144)
Total Protein: 7 g/dL (ref 6.0–8.5)

## 2020-02-06 LAB — CBC
Hematocrit: 53.7 % — ABNORMAL HIGH (ref 37.5–51.0)
Hemoglobin: 17.7 g/dL (ref 13.0–17.7)
MCH: 31.2 pg (ref 26.6–33.0)
MCHC: 33 g/dL (ref 31.5–35.7)
MCV: 95 fL (ref 79–97)
Platelets: 171 10*3/uL (ref 150–450)
RBC: 5.67 x10E6/uL (ref 4.14–5.80)
RDW: 12.4 % (ref 11.6–15.4)
WBC: 7.7 10*3/uL (ref 3.4–10.8)

## 2020-02-06 LAB — LIPID PANEL
Chol/HDL Ratio: 2.8 ratio (ref 0.0–5.0)
Cholesterol, Total: 99 mg/dL — ABNORMAL LOW (ref 100–199)
HDL: 36 mg/dL — ABNORMAL LOW (ref >39)
LDL Chol Calc (NIH): 43 mg/dL (ref 0–99)
Triglycerides: 109 mg/dL (ref 0–149)
VLDL Cholesterol Cal: 20 mg/dL (ref 5–40)

## 2020-02-06 LAB — TESTOSTERONE,FREE AND TOTAL
Testosterone, Free: 11 pg/mL (ref 7.2–24.0)
Testosterone: 125 ng/dL — ABNORMAL LOW (ref 264–916)

## 2020-02-06 LAB — TSH: TSH: 2.18 u[IU]/mL (ref 0.450–4.500)

## 2020-02-11 ENCOUNTER — Other Ambulatory Visit: Payer: Self-pay

## 2020-02-11 ENCOUNTER — Ambulatory Visit (INDEPENDENT_AMBULATORY_CARE_PROVIDER_SITE_OTHER): Payer: 59 | Admitting: *Deleted

## 2020-02-11 DIAGNOSIS — E349 Endocrine disorder, unspecified: Secondary | ICD-10-CM

## 2020-03-11 ENCOUNTER — Other Ambulatory Visit: Payer: Self-pay

## 2020-03-11 ENCOUNTER — Ambulatory Visit (INDEPENDENT_AMBULATORY_CARE_PROVIDER_SITE_OTHER): Payer: 59

## 2020-03-11 DIAGNOSIS — Z23 Encounter for immunization: Secondary | ICD-10-CM

## 2020-03-11 NOTE — Progress Notes (Signed)
   Covid-19 Vaccination Clinic  Name:  Jorge [name]    MRN: [id number] DOB: 06/27/1960  03/11/2020  [name] was observed post Covid-19 immunization for 15 minutes without incident. He was provided with Vaccine Information Sheet and instruction to access the V-Safe system.   [name] was instructed to call 911 with any severe reactions post vaccine: [name] Kitchen Difficulty breathing  . Swelling of face and throat  . A fast heartbeat  . A bad rash all over body  . Dizziness and weakness   Immunizations Administered    Name Date Dose VIS Date Route   Pfizer COVID-19 Vaccine 03/11/2020  8:29 AM 0.3 mL 02/05/2020 Intramuscular   Manufacturer: [place]   Lot: [id number]   [place]: [id number]

## 2020-03-17 ENCOUNTER — Ambulatory Visit: Payer: 59

## 2020-03-20 ENCOUNTER — Other Ambulatory Visit: Payer: Self-pay

## 2020-03-20 ENCOUNTER — Ambulatory Visit (INDEPENDENT_AMBULATORY_CARE_PROVIDER_SITE_OTHER): Payer: 59

## 2020-03-20 DIAGNOSIS — E349 Endocrine disorder, unspecified: Secondary | ICD-10-CM

## 2020-03-20 NOTE — Progress Notes (Signed)
Testosterone injection given and tolerated well.  

## 2020-04-21 ENCOUNTER — Ambulatory Visit (INDEPENDENT_AMBULATORY_CARE_PROVIDER_SITE_OTHER): Payer: 59

## 2020-04-21 ENCOUNTER — Other Ambulatory Visit: Payer: Self-pay

## 2020-04-21 DIAGNOSIS — E349 Endocrine disorder, unspecified: Secondary | ICD-10-CM | POA: Diagnosis not present

## 2020-04-21 NOTE — Progress Notes (Signed)
Testosterone injection given to right upper outer quadrant.  Patient tolerated well. 

## 2020-05-19 ENCOUNTER — Other Ambulatory Visit: Payer: Self-pay

## 2020-05-19 ENCOUNTER — Encounter: Payer: Self-pay | Admitting: Family Medicine

## 2020-05-19 ENCOUNTER — Ambulatory Visit (INDEPENDENT_AMBULATORY_CARE_PROVIDER_SITE_OTHER): Payer: 59 | Admitting: Family Medicine

## 2020-05-19 ENCOUNTER — Ambulatory Visit (INDEPENDENT_AMBULATORY_CARE_PROVIDER_SITE_OTHER): Payer: 59

## 2020-05-19 VITALS — BP 143/75 | HR 73 | Temp 97.7°F | Ht 64.0 in | Wt 162.6 lb

## 2020-05-19 DIAGNOSIS — M25532 Pain in left wrist: Secondary | ICD-10-CM | POA: Diagnosis not present

## 2020-05-19 MED ORDER — CIPROFLOXACIN HCL 500 MG PO TABS: 500.0000 mg | ORAL_TABLET | Freq: Two times a day (BID) | ORAL | 0 refills | Status: DC

## 2020-05-19 NOTE — Progress Notes (Signed)
Chief Complaint  Patient presents with  . Wrist Pain    Left, redness and edema Day 4    HPI  Patient presents today for onset 5 days ago of pain and swelling at the dorsal left wrist.  No known injury.  No previous similar incidents.  He has not had anything like this in his feet before either.  It is red as well as swollen.  It hurts to move it back and forth side to side of the wrist and gripping and making a fist is painful.   PMH: Smoking status noted ROS: Per HPI  Objective: BP (!) 143/75   Pulse 73   Temp 97.7 F (36.5 C)   Ht 5\' 4"  (1.626 m)   Wt 162 lb 9.6 oz (73.8 kg)   BMI 27.91 kg/m  Gen: NAD, alert, cooperative with exam HEENT: NCAT, EOMI, PERRL CV: RRR, good S1/S2, no murmur Resp: CTABL, no wheezes, non-labored Abd: SNTND, BS present, no guarding or organomegaly Ext: The left wrist is tender to palpation.  There is mild edema with erythema at the dorsum of the wrist.  This extends to the distal one third of the forearm.  There is decreased Grip Strength and Patient States That Is Painful.  Full Range Of Motion at the Elbow.  Painful for Supination Pronation As Well. Neuro: Alert and oriented, No gross deficits XR - noacute abnormality Assessment and plan:  1. Wrist pain, acute, left     Meds ordered this encounter  Medications  . ciprofloxacin (CIPRO) 500 MG tablet    Sig: Take 1 tablet (500 mg total) by mouth 2 (two) times daily.    Dispense:  20 tablet    Refill:  0    Orders Placed This Encounter  Procedures  . DG Wrist Complete Left    Standing Status:   Future    Number of Occurrences:   1    Standing Expiration Date:   06/16/2020    Order Specific Question:   Reason for Exam (SYMPTOM  OR DIAGNOSIS REQUIRED)    Answer:   pain, swelling, red, NKI    Order Specific Question:   Preferred imaging location?    Answer:   Internal  . CBC with Differential/Platelet  . Uric acid    Follow up as needed.  Claretta Fraise, MD

## 2020-05-20 ENCOUNTER — Other Ambulatory Visit: Payer: Self-pay | Admitting: Family Medicine

## 2020-05-20 ENCOUNTER — Telehealth: Payer: Self-pay

## 2020-05-20 ENCOUNTER — Ambulatory Visit: Payer: 59 | Admitting: Family Medicine

## 2020-05-20 LAB — CBC WITH DIFFERENTIAL/PLATELET
Basophils Absolute: 0 10*3/uL (ref 0.0–0.2)
Basos: 0 %
EOS (ABSOLUTE): 0 10*3/uL (ref 0.0–0.4)
Eos: 0 %
Hematocrit: 53.7 % — ABNORMAL HIGH (ref 37.5–51.0)
Hemoglobin: 18.1 g/dL — ABNORMAL HIGH (ref 13.0–17.7)
Immature Grans (Abs): 0 10*3/uL (ref 0.0–0.1)
Immature Granulocytes: 0 %
Lymphocytes Absolute: 1.5 10*3/uL (ref 0.7–3.1)
Lymphs: 16 %
MCH: 31.6 pg (ref 26.6–33.0)
MCHC: 33.7 g/dL (ref 31.5–35.7)
MCV: 94 fL (ref 79–97)
Monocytes Absolute: 0.5 10*3/uL (ref 0.1–0.9)
Monocytes: 5 %
Neutrophils Absolute: 7.3 10*3/uL — ABNORMAL HIGH (ref 1.4–7.0)
Neutrophils: 79 %
Platelets: 190 10*3/uL (ref 150–450)
RBC: 5.72 x10E6/uL (ref 4.14–5.80)
RDW: 11.9 % (ref 11.6–15.4)
WBC: 9.3 10*3/uL (ref 3.4–10.8)

## 2020-05-20 LAB — URIC ACID: Uric Acid: 6.9 mg/dL (ref 3.8–8.4)

## 2020-05-20 MED ORDER — TRAMADOL HCL 50 MG PO TABS: 50.0000 mg | ORAL_TABLET | Freq: Four times a day (QID) | ORAL | 0 refills | Status: AC

## 2020-05-20 NOTE — Telephone Encounter (Signed)
Patient aware and verbalized understanding. °

## 2020-05-20 NOTE — Telephone Encounter (Signed)
Patient notified and verbalized understanding. Wants to know what you think is wrong with his wrist. Please advise

## 2020-05-20 NOTE — Telephone Encounter (Signed)
There is cellulitis, an infection of the skin and the tissues just underneath it.

## 2020-05-20 NOTE — Telephone Encounter (Signed)
Labs are normal. I sent in tramadol for pain

## 2020-05-22 ENCOUNTER — Ambulatory Visit (INDEPENDENT_AMBULATORY_CARE_PROVIDER_SITE_OTHER): Payer: 59 | Admitting: *Deleted

## 2020-05-22 ENCOUNTER — Other Ambulatory Visit: Payer: Self-pay

## 2020-05-22 DIAGNOSIS — E349 Endocrine disorder, unspecified: Secondary | ICD-10-CM | POA: Diagnosis not present

## 2020-05-23 NOTE — Progress Notes (Signed)
Hello Jorge [name],  Your lab result is normal and/or stable.Some minor variations that are not significant are commonly marked abnormal, but do not represent any medical problem for you.  Best regards, Tula Schryver, M.D.

## 2020-05-26 ENCOUNTER — Ambulatory Visit (INDEPENDENT_AMBULATORY_CARE_PROVIDER_SITE_OTHER): Payer: 59 | Admitting: Family

## 2020-05-26 ENCOUNTER — Other Ambulatory Visit: Payer: Self-pay

## 2020-05-26 ENCOUNTER — Encounter: Payer: Self-pay | Admitting: Family

## 2020-05-26 ENCOUNTER — Encounter: Payer: Self-pay | Admitting: Family Medicine

## 2020-05-26 VITALS — BP 154/95 | HR 87 | Temp 98.0°F | Ht 64.0 in | Wt 162.2 lb

## 2020-05-26 DIAGNOSIS — L03114 Cellulitis of left upper limb: Secondary | ICD-10-CM

## 2020-05-26 MED ORDER — CEFTRIAXONE SODIUM 1 G IJ SOLR
1.0000 g | Freq: Once | INTRAMUSCULAR | Status: AC
  Administered 2020-05-26: 1 g via INTRAMUSCULAR

## 2020-05-26 MED ORDER — DOXYCYCLINE HYCLATE 100 MG PO TABS
100.0000 mg | ORAL_TABLET | Freq: Two times a day (BID) | ORAL | 0 refills | Status: DC
Start: 2020-05-26 — End: 2020-08-04

## 2020-05-26 NOTE — Patient Instructions (Signed)

## 2020-05-26 NOTE — Progress Notes (Signed)
Subjective:    Patient ID: Jorge [name], male    DOB: 01/26/1961, 60 y.o.   MRN: [id number]  Chief Complaint  Patient presents with  . Wrist Pain    Left wrist seen stacks he dx with celluitis,. Better over the weekend and then got worse     HPI PT presents to the office today with left wrist and hand pain that started on 05/15/20. He was seen in the clinic on 05/19/20 and had an x-ray that was negative and had a negative uric acid level.  He was diagnosed with cellulitis and was given cipro 500 mg BID. He states after starting this medication he was feeling better in 2-3 days. However, last night he noticed increased swelling and increase pain. He reports aching, throbbing pain of 8 out 10.   He has been wearing a brace while at work.    Review of Systems  All other systems reviewed and are negative.      Objective:   Physical Exam Vitals reviewed.  Constitutional:      General: He is not in acute distress.    Appearance: He is well-developed and well-nourished.  HENT:     Head: Normocephalic.     Right Ear: Tympanic membrane normal.     Left Ear: Tympanic membrane normal.     Mouth/Throat:     Mouth: Oropharynx is clear and moist.  Eyes:     General:        Right eye: No discharge.        Left eye: No discharge.     Pupils: Pupils are equal, round, and reactive to light.  Neck:     Thyroid: No thyromegaly.  Cardiovascular:     Rate and Rhythm: Normal rate and regular rhythm.     Pulses: Intact distal pulses.     Heart sounds: Normal heart sounds. No murmur heard.   Pulmonary:     Effort: Pulmonary effort is normal. No respiratory distress.     Breath sounds: Normal breath sounds. No wheezing.  Abdominal:     General: Bowel sounds are normal. There is no distension.     Palpations: Abdomen is soft.     Tenderness: There is no abdominal tenderness.  Musculoskeletal:        General: Swelling and tenderness present. No edema.     Cervical back: Normal  range of motion and neck supple.     Comments: Moderate swelling in left wrist, hand, and fingers. Warmth and erythemas present. No discharge.   Skin:    General: Skin is warm and dry.     Findings: No erythema or rash.  Neurological:     Mental Status: He is alert and oriented to person, place, and time.     Cranial Nerves: No cranial nerve deficit.     Deep Tendon Reflexes: Reflexes are normal and symmetric.  Psychiatric:        Mood and Affect: Mood and affect normal.        Behavior: Behavior normal.        Thought Content: Thought content normal.        Judgment: Judgment normal.       BP (!) 154/95   Pulse 87   Temp 98 F (36.7 C) (Temporal)   Ht 5\' 4"  (1.626 m)   Wt 162 lb 3.2 oz (73.6 kg)   SpO2 96%   BMI 27.84 kg/m      Assessment & Plan:  Jorge Angelica  [name] comes in today with chief complaint of Wrist Pain (Left wrist seen stacks he dx with celluitis,. Better over the weekend and then got worse )   Diagnosis and orders addressed:  1. Cellulitis of left upper extremity Will stop cipro and start doxycycline Discussed if redness, swelling, tenderness worsen need to call office.  Continue to wear brace, ice, light duty at work, and elevate when possible  RTO  On Monday to recheck  - doxycycline (VIBRA-TABS) 100 MG tablet; Take 1 tablet (100 mg total) by mouth 2 (two) times daily.  Dispense: 20 tablet; Refill: 0 - cefTRIAXone (ROCEPHIN) injection 1 g   Evelina Dun, FNP

## 2020-05-28 ENCOUNTER — Other Ambulatory Visit: Payer: Self-pay | Admitting: Family

## 2020-05-28 DIAGNOSIS — M25532 Pain in left wrist: Secondary | ICD-10-CM

## 2020-06-01 ENCOUNTER — Emergency Department (HOSPITAL_COMMUNITY)
Admission: EM | Admit: 2020-06-01 | Discharge: 2020-06-01 | Disposition: A | Discharge status: Home / Self Care | Payer: 59 | Attending: Emergency Medicine | Admitting: Emergency Medicine

## 2020-06-01 ENCOUNTER — Ambulatory Visit: Payer: 59 | Admitting: Family

## 2020-06-01 ENCOUNTER — Encounter (HOSPITAL_COMMUNITY): Payer: Self-pay

## 2020-06-01 ENCOUNTER — Other Ambulatory Visit: Payer: Self-pay

## 2020-06-01 ENCOUNTER — Emergency Department (HOSPITAL_COMMUNITY): Payer: 59

## 2020-06-01 DIAGNOSIS — M25532 Pain in left wrist: Secondary | ICD-10-CM | POA: Diagnosis present

## 2020-06-01 DIAGNOSIS — M2550 Pain in unspecified joint: Secondary | ICD-10-CM

## 2020-06-01 DIAGNOSIS — Z79899 Other long term (current) drug therapy: Secondary | ICD-10-CM | POA: Diagnosis not present

## 2020-06-01 DIAGNOSIS — M25572 Pain in left ankle and joints of left foot: Secondary | ICD-10-CM | POA: Insufficient documentation

## 2020-06-01 DIAGNOSIS — I10 Essential (primary) hypertension: Secondary | ICD-10-CM | POA: Diagnosis not present

## 2020-06-01 LAB — CBC WITH DIFFERENTIAL/PLATELET
Basophils Absolute: 0 10*3/uL (ref 0.0–0.1)
Basophils Relative: 0 %
Eosinophils Absolute: 0 10*3/uL (ref 0.0–0.5)
Eosinophils Relative: 0 %
HCT: 52.8 % — ABNORMAL HIGH (ref 39.0–52.0)
Hemoglobin: 17.4 g/dL — ABNORMAL HIGH (ref 13.0–17.0)
Lymphocytes Relative: 14 %
Lymphs Abs: 1.5 10*3/uL (ref 0.7–4.0)
MCH: 32 pg (ref 26.0–34.0)
MCHC: 33 g/dL (ref 30.0–36.0)
MCV: 97.1 fL (ref 80.0–100.0)
Monocytes Absolute: 0.9 10*3/uL (ref 0.1–1.0)
Monocytes Relative: 8 %
Neutro Abs: 8.7 10*3/uL — ABNORMAL HIGH (ref 1.7–7.7)
Neutrophils Relative %: 77 %
Platelets: 240 10*3/uL (ref 150–400)
RBC: 5.44 MIL/uL (ref 4.22–5.81)
RDW: 12.3 % (ref 11.5–15.5)
WBC: 11.3 10*3/uL — ABNORMAL HIGH (ref 4.0–10.5)
nRBC: 0 /100 WBC

## 2020-06-01 MED ORDER — TRAMADOL HCL 50 MG PO TABS: 50.0000 mg | ORAL_TABLET | Freq: Four times a day (QID) | ORAL | 0 refills | Status: DC | PRN

## 2020-06-01 MED ORDER — PREDNISONE 20 MG PO TABS
40.0000 mg | ORAL_TABLET | Freq: Once | ORAL | Status: AC
  Administered 2020-06-01: 40 mg via ORAL
  Filled 2020-06-01: qty 2

## 2020-06-01 MED ORDER — PREDNISONE 10 MG PO TABS: ORAL_TABLET | ORAL | 0 refills | Status: DC

## 2020-06-01 NOTE — ED Provider Notes (Signed)
[facility] EMERGENCY DEPARTMENT Provider Note   CSN: [id number] Arrival date & time: 06/01/20  1751     History Chief Complaint  Patient presents with  . Wrist Pain  . Ankle Pain    Jorge [name] is a 60 y.o. male.  HPI      Jorge [name] is a 60 y.o. male who presents to the Emergency Department complaining of continued pain of his left wrist x2 weeks.  He was seen by his PCPs office at onset of pain and prescribed ciprofloxacin and labs were obtained.  Uric acid within normal limits.  Symptoms persisted and he was then switched from Cipro to doxycycline and given an injection of Rocephin.  He states the swelling improved, but he continues to have pain of his wrist and difficulty with gripping objects.  2 days ago, he noticed similar symptoms developing in his lateral left ankle.  He describes having redness and swelling along the joint with pain associated with movement and standing.  No known injury.  Denies fever, chills, open wounds or rash.  No numbness or weakness of the other extremities.  No history of known autoimmune disorders.    Past Medical History:  Diagnosis Date  . Abnormal liver enzymes   . Duodenitis   . Hemorrhoids   . Hiatal hernia   . Hyperlipidemia   . Hypertension   . Kidney stones   . Testosterone deficiency   . Tubular adenoma of colon     Patient Active Problem List   Diagnosis Date Noted  . BMI 26.0-26.9,adult 01/18/2019  . Nephrolithiasis 02/13/2014  . Metabolic syndrome 02/58/5277  . Hyperlipidemia 10/31/2012  . Hypertension 10/31/2012  . Testosterone deficiency 10/31/2012  . Vitamin D deficiency 10/31/2012    Past Surgical History:  Procedure Laterality Date  . LITHOTRIPSY    . VASECTOMY         Family History  Problem Relation Age of Onset  . Heart disease Mother 71       Heart Valve Disease  . Hypertension Mother   . Diabetes Father   . Colon cancer Father     Social History   Tobacco Use  . Smoking status: Never  Smoker  . Smokeless tobacco: Never Used  Substance Use Topics  . Alcohol use: No  . Drug use: No    Home Medications Prior to Admission medications   Medication Sig Start Date End Date Taking? Authorizing Provider  atenolol (TENORMIN) 25 MG tablet Take 1 tablet (25 mg total) by mouth daily. Patient taking differently: Take 12.5 mg by mouth daily. 06/17/19   Baruch Gouty, FNP  cholecalciferol (VITAMIN D) 1000 UNITS tablet Take 2,000 Units by mouth daily.    [provider]  ciprofloxacin (CIPRO) 500 MG tablet Take 1 tablet (500 mg total) by mouth 2 (two) times daily. 05/19/20   Claretta Fraise, MD  doxycycline (VIBRA-TABS) 100 MG tablet Take 1 tablet (100 mg total) by mouth 2 (two) times daily. 05/26/20   Sharion Balloon, FNP  levocetirizine (XYZAL) 5 MG tablet Take 1 tablet (5 mg total) by mouth every evening. 11/19/19   Loman Brooklyn, FNP  Multiple Vitamins-Minerals (MULTIVITAMIN ADULT PO) Take by mouth.    [provider]  omeprazole (PRILOSEC OTC) 20 MG tablet Take 1 tablet (20 mg total) by mouth daily. 01/18/19   Hassell Done [name], FNP  rosuvastatin (CRESTOR) 40 MG tablet TAKE ONE (1) TABLET EACH DAY 02/04/20   Ronnie Doss M, DO  sildenafil (VIAGRA) 100 MG  tablet Take 0.5-1 tablets (50-100 mg total) by mouth daily as needed for erectile dysfunction. 02/04/20   [name], DO  testosterone cypionate (DEPOTESTOSTERONE CYPIONATE) 200 MG/ML injection Inject 1 mL (200 mg total) into the muscle every 28 (twenty-eight) days. 11/05/19   [name], DO  Turmeric (QC TUMERIC COMPLEX PO) Take by mouth.    [provider]  vitamin C (ASCORBIC ACID) 500 MG tablet Take 500 mg by mouth daily.    [provider]    Allergies    Penicillins  Review of Systems   Review of Systems  Constitutional: Negative for chills and fever.  Respiratory: Negative for shortness of breath.   Cardiovascular: Negative for chest pain.  Gastrointestinal:  Negative for nausea and vomiting.  Genitourinary: Negative for difficulty urinating and dysuria.  Musculoskeletal: Positive for arthralgias (left wrist and ankle pain) and joint swelling (left ankle swelling). Negative for back pain and neck pain.  Skin: Negative for color change, rash and wound.  Neurological: Negative for dizziness, weakness, numbness and headaches.    Physical Exam Updated Vital Signs BP (!) 159/98 (BP Location: Right Arm)   Pulse 84   Temp 97.9 F (36.6 C) (Oral)   Resp 18   Ht 5\' 4"  (1.626 m)   Wt 73 kg   SpO2 93%   BMI 27.62 kg/m   Physical Exam Vitals and nursing note reviewed.  Constitutional:      General: He is not in acute distress.    Appearance: Normal appearance. He is well-developed and well-nourished. He is not ill-appearing or toxic-appearing.  HENT:     Head: Atraumatic.  Cardiovascular:     Rate and Rhythm: Normal rate and regular rhythm.     Pulses: Normal pulses and intact distal pulses.  Pulmonary:     Effort: Pulmonary effort is normal.     Breath sounds: Normal breath sounds.  Musculoskeletal:        General: Swelling and tenderness present. No signs of injury.     Cervical back: No rigidity.     Comments: ttp localized to the lateral left ankle.  Mild edema and erythema.  No excessive warmth, rash or lymphangitis.  No tenderness of the foot or lower leg.  Compartments are soft.  No erythema or edema of the left wrist.  Positive Finkelstein test and tenderness with ROM of the left thumb, remaining fingers non tender  Lymphadenopathy:     Cervical: No cervical adenopathy.  Skin:    General: Skin is warm.     Capillary Refill: Capillary refill takes less than 2 seconds.  Neurological:     General: No focal deficit present.     Mental Status: He is alert.     Sensory: No sensory deficit.     Motor: No weakness or abnormal muscle tone.     ED Results / Procedures / Treatments   Labs (all labs ordered are listed, but only  abnormal results are displayed) Labs Reviewed  CBC WITH DIFFERENTIAL/PLATELET - Abnormal; Notable for the following components:      Result Value   WBC 11.3 (*)    Hemoglobin 17.4 (*)    HCT 52.8 (*)    Neutro Abs 8.7 (*)    All other components within normal limits  CULTURE, BLOOD (ROUTINE X 2)  CULTURE, BLOOD (ROUTINE X 2)    EKG None  Radiology DG Ankle Complete Left  Result Date: 06/01/2020 CLINICAL DATA:  Pain and swelling EXAM: LEFT ANKLE COMPLETE -  3+ VIEW COMPARISON:  None. FINDINGS: Frontal, oblique, and lateral views were obtained. There is no fracture or joint effusion. There is no appreciable joint space narrowing or erosion. There is an inferior calcaneal spur. Ankle mortise appears intact. IMPRESSION: No evident fracture or appreciable joint space narrowing. There is an inferior calcaneal spur. Ankle mortise appears intact. Electronically Signed   By: Lowella Grip III M.D.   On: 06/01/2020 10:08    Procedures Procedures   Medications Ordered in ED Medications  predniSONE (DELTASONE) tablet 40 mg (40 mg Oral Given 06/01/20 1253)    ED Course  I have reviewed the triage vital signs and the nursing notes.  Pertinent labs & imaging results that were available during my care of the patient were reviewed by me and considered in my medical decision making (see chart for details).    MDM Rules/Calculators/A&P                          Pt here with 2 week hx of left wrist pain and now also having pain swelling of the left ankle.  NV intact.  He was seen by PCP regarding his wrist pain.  Was treated with abx w/o relief.  Had nml uric acid and CBC fm PCP office.  No fever chills, recent illness, tick bite or family hx of autoimmune disorders.    Repeat CBC today w/o significant  leukocytosis, blood cultures obtained, results pending.  Doubt this is an infectious process.  Felt to be inflammatory.  No hx of gout.  He has upcoming appt with orthopedics.  Doubt emergent  process.  Will start prednisone and apply ASO brace.  Pt agrees to plan and to keep his upcoming appt.  Appears appropriate for d/c.  Return precautions discussed.   Final Clinical Impression(s) / ED Diagnoses Final diagnoses:  Polyarthralgia    Rx / DC Orders ED Discharge Orders    None       Kem Parkinson, PA-C 06/02/20 1524    Hayden Rasmussen, MD 06/02/20 1727

## 2020-06-01 NOTE — Discharge Instructions (Addendum)
Continue to wear your wrist splint as directed.  You may wear the ankle brace as needed for support.  You may remove for bathing.  Elevate your leg when possible.  Start the prednisone prescription tomorrow.  Be sure to keep your upcoming appointment with your orthopedic provider.  Return to the emergency department if you develop any worsening symptoms.

## 2020-06-01 NOTE — ED Triage Notes (Addendum)
Pt reports he was treated for cellulitis in left wrist approx 2 weeks and pain still present Pt reports now left ankle is hurting and swollen. Pt reports unable to put pressure on his foot

## 2020-06-01 NOTE — ED Notes (Signed)
ED Provider at bedside. 

## 2020-06-01 NOTE — ED Notes (Addendum)
Entered room and introduced self to patient. Pt appears to be resting in bed, respirations are even and unlabored with equal chest rise and fall. Bed is locked in the lowest position, side rails x2, call bell within reach. Pt educated on call light use and hourly rounding, verbalized understanding and in agreement at this time. All questions and concerns voiced addressed. Refreshments offered and provided per patient request.  

## 2020-06-01 NOTE — ED Notes (Signed)
Lab at bedside at this time.  

## 2020-06-04 ENCOUNTER — Encounter: Payer: Self-pay | Admitting: Family Medicine

## 2020-06-06 LAB — CULTURE, BLOOD (ROUTINE X 2)
Culture: NO GROWTH
Culture: NO GROWTH

## 2020-06-10 ENCOUNTER — Ambulatory Visit: Payer: 59 | Admitting: Orthopaedic Surgery

## 2020-06-22 ENCOUNTER — Other Ambulatory Visit: Payer: Self-pay

## 2020-06-22 ENCOUNTER — Ambulatory Visit (INDEPENDENT_AMBULATORY_CARE_PROVIDER_SITE_OTHER): Payer: 59 | Admitting: Family Medicine

## 2020-06-22 DIAGNOSIS — E349 Endocrine disorder, unspecified: Secondary | ICD-10-CM | POA: Diagnosis not present

## 2020-07-24 ENCOUNTER — Other Ambulatory Visit: Payer: Self-pay

## 2020-07-24 ENCOUNTER — Ambulatory Visit (INDEPENDENT_AMBULATORY_CARE_PROVIDER_SITE_OTHER): Payer: 59 | Admitting: *Deleted

## 2020-07-24 DIAGNOSIS — E349 Endocrine disorder, unspecified: Secondary | ICD-10-CM

## 2020-07-24 NOTE — Progress Notes (Signed)
Pt tolerated testosterone shot well

## 2020-08-04 ENCOUNTER — Ambulatory Visit (INDEPENDENT_AMBULATORY_CARE_PROVIDER_SITE_OTHER): Payer: 59 | Admitting: Family Medicine

## 2020-08-04 ENCOUNTER — Other Ambulatory Visit: Payer: Self-pay

## 2020-08-04 ENCOUNTER — Encounter: Payer: Self-pay | Admitting: Family Medicine

## 2020-08-04 VITALS — BP 137/74 | HR 91 | Temp 98.2°F | Ht 64.0 in | Wt 161.0 lb

## 2020-08-04 DIAGNOSIS — N5201 Erectile dysfunction due to arterial insufficiency: Secondary | ICD-10-CM | POA: Diagnosis not present

## 2020-08-04 DIAGNOSIS — Z8739 Personal history of other diseases of the musculoskeletal system and connective tissue: Secondary | ICD-10-CM | POA: Diagnosis not present

## 2020-08-04 DIAGNOSIS — E349 Endocrine disorder, unspecified: Secondary | ICD-10-CM

## 2020-08-04 DIAGNOSIS — Z79899 Other long term (current) drug therapy: Secondary | ICD-10-CM | POA: Diagnosis not present

## 2020-08-04 NOTE — Progress Notes (Signed)
Subjective: CC: testosterone deficiency PCP: [name], DO OYD:XAJOIN Jorge [name] is a 60 y.o. male presenting to clinic today for:  1. Testosterone Deficiency Patient continues to inject testosterone as prescribed.  His last hemoglobin and hematocrit were noted to be elevated in February but he does note that he was having a gout issue at that time.  Likely he has gone over the gout and has not had any recurrence.  He continues to experience erectile dysfunction and Viagra seems to help with this.  Denies any gynecomastia, testicular shrinkage.   ROS: Per HPI  Allergies  Allergen Reactions  . Penicillins Rash   Past Medical History:  Diagnosis Date  . Abnormal liver enzymes   . Duodenitis   . Hemorrhoids   . Hiatal hernia   . Hyperlipidemia   . Hypertension   . Kidney stones   . Testosterone deficiency   . Tubular adenoma of colon     Current Outpatient Medications:  .  atenolol (TENORMIN) 25 MG tablet, Take 1 tablet (25 mg total) by mouth daily. (Patient taking differently: Take 12.5 mg by mouth daily.), Disp: 90 tablet, Rfl: 3 .  cholecalciferol (VITAMIN D) 1000 UNITS tablet, Take 2,000 Units by mouth daily., Disp: , Rfl:  .  levocetirizine (XYZAL) 5 MG tablet, Take 1 tablet (5 mg total) by mouth every evening., Disp: 30 tablet, Rfl: 2 .  Multiple Vitamins-Minerals (MULTIVITAMIN ADULT PO), Take by mouth., Disp: , Rfl:  .  omeprazole (PRILOSEC OTC) 20 MG tablet, Take 1 tablet (20 mg total) by mouth daily., Disp: 90 tablet, Rfl: 1 .  rosuvastatin (CRESTOR) 40 MG tablet, TAKE ONE (1) TABLET EACH DAY, Disp: 180 tablet, Rfl: 1 .  sildenafil (VIAGRA) 100 MG tablet, Take 0.5-1 tablets (50-100 mg total) by mouth daily as needed for erectile dysfunction., Disp: 5 tablet, Rfl: 11 .  testosterone cypionate (DEPOTESTOSTERONE CYPIONATE) 200 MG/ML injection, Inject 1 mL (200 mg total) into the muscle every 28 (twenty-eight) days., Disp: 10 mL, Rfl: 1 .  Turmeric (QC TUMERIC COMPLEX  PO), Take by mouth., Disp: , Rfl:  .  vitamin C (ASCORBIC ACID) 500 MG tablet, Take 500 mg by mouth daily., Disp: , Rfl:   Current Facility-Administered Medications:  .  testosterone cypionate (DEPOTESTOSTERONE CYPIONATE) injection 200 mg, 200 mg, Intramuscular, Q28 days, Hassell Done, [name], FNP, 200 mg at 07/24/20 1510 Social History   Socioeconomic History  . Marital status: Married    Spouse name: Not on file  . Number of children: 1  . Years of education: Not on file  . Highest education level: Not on file  Occupational History  . Occupation: Management  Tobacco Use  . Smoking status: Never Smoker  . Smokeless tobacco: Never Used  Substance and Sexual Activity  . Alcohol use: No  . Drug use: No  . Sexual activity: Not on file  Other Topics Concern  . Not on file  Social History Narrative  . Not on file   Social Determinants of Health   Financial Resource Strain: Not on file  Food Insecurity: Not on file  Transportation Needs: Not on file  Physical Activity: Not on file  Stress: Not on file  Social Connections: Not on file  Intimate Partner Violence: Not on file   Family History  Problem Relation Age of Onset  . Heart disease Mother 28       Heart Valve Disease  . Hypertension Mother   . Diabetes Father   . Colon cancer Father  Objective: Office vital signs reviewed. BP 137/74   Pulse 91   Temp 98.2 F (36.8 C)   Ht 5\' 4"  (1.626 m)   Wt 161 lb (73 kg)   SpO2 95%   BMI 27.64 kg/m   Physical Examination:  General: Awake, alert, well nourished, No acute distress HEENT: Normal, sclera white, MMM Cardio: regular rate and rhythm, S1S2 heard, no murmurs appreciated Pulm: clear to auscultation bilaterally, no wheezes, rhonchi or rales; normal work of breathing on room air GI: soft, non-tender, non-distended, bowel sounds present x4, no hepatomegaly, no splenomegaly, no masses Extremities: warm, well perfused, No edema, cyanosis or clubbing; +2 pulses  bilaterally  Assessment/ Plan: 60 y.o. male   Testosterone deficiency - Plan: PSA, CBC, Testosterone,Free and Total, Drug Screen 10 W/Conf, Se, CANCELED: ToxASSURE Select 13 (MW), Urine  Erectile dysfunction due to arterial insufficiency  Controlled substance agreement signed - Plan: Drug Screen 10 W/Conf, Se, CANCELED: ToxASSURE Select 13 (MW), Urine  Personal history of gout  Plan for PSA, CBC and testosterone tomorrow morning at 8:00.  We initially ordered the toxassure and compliance with: Health policy for drug screening and CSC given his controlled substance but unfortunately he did not leave sufficient urine so we will add a blood test with tomorrow's labs.  Of note his last hemoglobin and hematocrit were elevated.  We discussed that if we find this to be persistently elevated and/or testosterone is persistently low, will need to consider eval by urology for further management of this medication.  He is not due for cholesterol screening or metabolic panel yet.    Gave him information on [facility] for more affordable Viagra.  We will plan to follow-up in 6 months, sooner if needed.  No orders of the defined types were placed in this encounter.  No orders of the defined types were placed in this encounter.    [name], DO [place] [phone]

## 2020-08-05 ENCOUNTER — Other Ambulatory Visit: Payer: 59

## 2020-08-05 DIAGNOSIS — E349 Endocrine disorder, unspecified: Secondary | ICD-10-CM

## 2020-08-05 DIAGNOSIS — Z79899 Other long term (current) drug therapy: Secondary | ICD-10-CM

## 2020-08-11 ENCOUNTER — Other Ambulatory Visit: Payer: Self-pay | Admitting: *Deleted

## 2020-08-11 ENCOUNTER — Other Ambulatory Visit: Payer: Self-pay

## 2020-08-11 DIAGNOSIS — R718 Other abnormality of red blood cells: Secondary | ICD-10-CM

## 2020-08-11 DIAGNOSIS — E349 Endocrine disorder, unspecified: Secondary | ICD-10-CM

## 2020-08-11 DIAGNOSIS — D582 Other hemoglobinopathies: Secondary | ICD-10-CM

## 2020-08-12 LAB — CBC
Hematocrit: 55.3 % — ABNORMAL HIGH (ref 37.5–51.0)
Hemoglobin: 18.7 g/dL — ABNORMAL HIGH (ref 13.0–17.7)
MCH: 32 pg (ref 26.6–33.0)
MCHC: 33.8 g/dL (ref 31.5–35.7)
MCV: 95 fL (ref 79–97)
Platelets: 192 10*3/uL (ref 150–450)
RBC: 5.84 x10E6/uL — ABNORMAL HIGH (ref 4.14–5.80)
RDW: 13.5 % (ref 11.6–15.4)
WBC: 9.2 10*3/uL (ref 3.4–10.8)

## 2020-08-12 LAB — DRUG SCREEN 10 W/CONF, SERUM
Amphetamines, IA: NEGATIVE ng/mL
Barbiturates, IA: NEGATIVE ug/mL
Benzodiazepines, IA: NEGATIVE ng/mL
Cocaine & Metabolite, IA: NEGATIVE ng/mL
Methadone, IA: NEGATIVE ng/mL
Opiates, IA: NEGATIVE ng/mL
Oxycodones, IA: NEGATIVE ng/mL
Phencyclidine, IA: NEGATIVE ng/mL
Propoxyphene, IA: NEGATIVE ng/mL
THC(Marijuana) Metabolite, IA: NEGATIVE ng/mL

## 2020-08-12 LAB — TESTOSTERONE,FREE AND TOTAL
Testosterone, Free: 16.4 pg/mL (ref 7.2–24.0)
Testosterone: 477 ng/dL (ref 264–916)

## 2020-08-12 LAB — PSA: Prostate Specific Ag, Serum: 3.4 ng/mL (ref 0.0–4.0)

## 2020-08-24 ENCOUNTER — Ambulatory Visit (INDEPENDENT_AMBULATORY_CARE_PROVIDER_SITE_OTHER): Payer: 59

## 2020-08-24 ENCOUNTER — Other Ambulatory Visit: Payer: Self-pay

## 2020-08-24 DIAGNOSIS — E291 Testicular hypofunction: Secondary | ICD-10-CM

## 2020-08-24 NOTE — Progress Notes (Signed)
Testosterone injection given to right upper outer quadrant.  Patient tolerated well. 

## 2020-09-24 ENCOUNTER — Ambulatory Visit: Payer: 59

## 2020-10-08 ENCOUNTER — Ambulatory Visit (INDEPENDENT_AMBULATORY_CARE_PROVIDER_SITE_OTHER): Payer: 59 | Admitting: Urology

## 2020-10-08 ENCOUNTER — Other Ambulatory Visit: Payer: Self-pay

## 2020-10-08 ENCOUNTER — Encounter: Payer: Self-pay | Admitting: Urology

## 2020-10-08 VITALS — BP 175/90 | HR 71 | Temp 98.9°F | Ht 65.0 in | Wt 160.0 lb

## 2020-10-08 DIAGNOSIS — R972 Elevated prostate specific antigen [PSA]: Secondary | ICD-10-CM | POA: Diagnosis not present

## 2020-10-08 DIAGNOSIS — N5201 Erectile dysfunction due to arterial insufficiency: Secondary | ICD-10-CM | POA: Diagnosis not present

## 2020-10-08 DIAGNOSIS — R7989 Other specified abnormal findings of blood chemistry: Secondary | ICD-10-CM | POA: Diagnosis not present

## 2020-10-08 DIAGNOSIS — D751 Secondary polycythemia: Secondary | ICD-10-CM | POA: Diagnosis not present

## 2020-10-08 LAB — URINALYSIS, ROUTINE W REFLEX MICROSCOPIC
Bilirubin, UA: NEGATIVE
Glucose, UA: NEGATIVE
Ketones, UA: NEGATIVE
Leukocytes,UA: NEGATIVE
Nitrite, UA: NEGATIVE
Protein,UA: NEGATIVE
RBC, UA: NEGATIVE
Specific Gravity, UA: 1.02 (ref 1.005–1.030)
Urobilinogen, Ur: 0.2 mg/dL (ref 0.2–1.0)
pH, UA: 5.5 (ref 5.0–7.5)

## 2020-10-08 NOTE — Progress Notes (Signed)
Urological Symptom Review  Patient is experiencing the following symptoms: Erection problems (male only) Kidney stones   Review of Systems  Gastrointestinal (upper)  : Indigestion/heartburn  Gastrointestinal (lower) : Negative for lower GI symptoms  Constitutional : Negative for symptoms  Skin: Negative for skin symptoms  Eyes: Negative for eye symptoms  Ear/Nose/Throat : Negative for Ear/Nose/Throat symptoms  Hematologic/Lymphatic: Negative for Hematologic/Lymphatic symptoms  Cardiovascular : Negative for cardiovascular symptoms  Respiratory : Negative for respiratory symptoms  Endocrine: Negative for endocrine symptoms  Musculoskeletal: Negative for musculoskeletal symptoms  Neurological: Negative for neurological symptoms  Psychologic: Negative for psychiatric symptoms

## 2020-10-08 NOTE — Progress Notes (Signed)
Subjective: 1. Low testosterone   2. Polycythemia, secondary   3. Erectile dysfunction due to arterial insufficiency   4. Elevated PSA      Consult requested by Dr. Ronnie Doss.  Jorge [name] is a 60 yo male who is sent by Dr. Lajuana Ripple for low testosterone.   He was initially on androgel for a year but didn't improve with fatigue and reduced libido.   He was changed to IM testosterone and is getting 200mg  IM monthly.   His most recent T was 477 in 4/22.  It was 121 in 10/21.   His Hgb has been rising on therapy but is somewhat high at baseline.   His most recent was 18.7 with a 55.3% HCT.   He has ED and responds to 50mg  Viagra.  He will lose the erection but get it back in 5 min.  He is a never smoker.   He is voiding well with an IPSS of 2.  HIs PSA has increased over time with a level of 1.5 in 6/17, 2.0 in 4/21 and 3.4 in 4/22.  ROS:  ROS  Allergies  Allergen Reactions   Penicillins Rash    Past Medical History:  Diagnosis Date   Abnormal liver enzymes    Duodenitis    Gout    Hemorrhoids    Hiatal hernia    Hyperlipidemia    Hypertension    Kidney stones    Testosterone deficiency    Tubular adenoma of colon     Past Surgical History:  Procedure Laterality Date   LITHOTRIPSY     VASECTOMY      Social History   Socioeconomic History   Marital status: Married    Spouse name: Not on file   Number of children: 1   Years of education: Not on file   Highest education level: Not on file  Occupational History   Occupation: Management  Tobacco Use   Smoking status: Never   Smokeless tobacco: Never  Substance and Sexual Activity   Alcohol use: No   Drug use: No   Sexual activity: Not on file  Other Topics Concern   Not on file  Social History Narrative   Not on file   Social Determinants of Health   Financial Resource Strain: Not on file  Food Insecurity: Not on file  Transportation Needs: Not on file  Physical Activity: Not on file  Stress: Not on  file  Social Connections: Not on file  Intimate Partner Violence: Not on file    Family History  Problem Relation Age of Onset   Heart disease Mother 26       Heart Valve Disease   Hypertension Mother    Diabetes Father    Colon cancer Father     Anti-infectives: Anti-infectives (From admission, onward)    None       Current Outpatient Medications  Medication Sig Dispense Refill   atenolol (TENORMIN) 25 MG tablet Take 1 tablet (25 mg total) by mouth daily. (Patient taking differently: Take 12.5 mg by mouth daily.) 90 tablet 3   cholecalciferol (VITAMIN D) 1000 UNITS tablet Take 2,000 Units by mouth daily.     levocetirizine (XYZAL) 5 MG tablet Take 1 tablet (5 mg total) by mouth every evening. 30 tablet 2   Multiple Vitamins-Minerals (MULTIVITAMIN ADULT PO) Take by mouth.     omeprazole (PRILOSEC OTC) 20 MG tablet Take 1 tablet (20 mg total) by mouth daily. 90 tablet 1   rosuvastatin (CRESTOR) 40  MG tablet TAKE ONE (1) TABLET EACH DAY 180 tablet 1   sildenafil (VIAGRA) 100 MG tablet Take 0.5-1 tablets (50-100 mg total) by mouth daily as needed for erectile dysfunction. 5 tablet 11   testosterone cypionate (DEPOTESTOSTERONE CYPIONATE) 200 MG/ML injection Inject 1 mL (200 mg total) into the muscle every 28 (twenty-eight) days. 10 mL 1   Turmeric (QC TUMERIC COMPLEX PO) Take by mouth.     vitamin C (ASCORBIC ACID) 500 MG tablet Take 500 mg by mouth daily.     Current Facility-Administered Medications  Medication Dose Route Frequency Provider Last Rate Last Admin   testosterone cypionate (DEPOTESTOSTERONE CYPIONATE) injection 200 mg  200 mg Intramuscular Q28 days Chevis Pretty, FNP   200 mg at 08/24/20 1545     Objective: Vital signs in last 24 hours: BP (!) 175/90   Pulse 71   Temp 98.9 F (37.2 C)   Ht 5\' 5"  (1.651 m)   Wt 160 lb (72.6 kg)   BMI 26.63 kg/m   Intake/Output from previous day: No intake/output data recorded. Intake/Output this  shift: @IOTHISSHIFT @   Physical Exam Vitals reviewed.  Constitutional:      Appearance: Normal appearance.  Cardiovascular:     Rate and Rhythm: Normal rate and regular rhythm.     Heart sounds: Normal heart sounds.  Pulmonary:     Effort: Pulmonary effort is normal. No respiratory distress.     Breath sounds: Normal breath sounds.  Abdominal:     General: Abdomen is flat.     Palpations: Abdomen is soft.  Genitourinary:    Comments: Normal circ phallus with adequate meatus. Scrotum, testes and epididymis normal. AP without lesions. NST without mass. Prostate 1.5+ without nodules. SV's non-palpable.  Musculoskeletal:        General: No swelling or tenderness. Normal range of motion.     Cervical back: Normal range of motion and neck supple.  Skin:    General: Skin is warm and dry.  Neurological:     General: No focal deficit present.     Mental Status: He is alert and oriented to person, place, and time.  Psychiatric:        Mood and Affect: Mood normal.        Behavior: Behavior normal.    Lab Results:  Recent Results (from the past 2160 hour(s))  Drug Screen 10 W/Conf, Se     Status: None   Collection Time: 08/05/20  8:14 AM  Result Value Ref Range   Amphetamines, IA Negative Cutoff:50 ng/mL   Barbiturates, IA Negative Cutoff:0.1 ug/mL   Benzodiazepines, IA Negative Cutoff:20 ng/mL   Cocaine & Metabolite, IA Negative Cutoff:25 ng/mL   Phencyclidine, IA Negative Cutoff:8 ng/mL   THC(Marijuana) Metabolite, IA Negative Cutoff:5 ng/mL   Opiates, IA Negative Cutoff:5 ng/mL   Oxycodones, IA Negative Cutoff:5 ng/mL   Methadone, IA Negative Cutoff:25 ng/mL   Propoxyphene, IA Negative Cutoff:50 ng/mL    Comment: This test was developed and its performance characteristics determined by [facility].  It has not been cleared or approved by the Food and Drug Administration.   Testosterone,Free and Total     Status: None   Collection Time: 08/05/20  8:14 AM  Result Value Ref  Range   Testosterone 477 264 - 916 ng/dL    Comment: Adult male reference interval is based on a population of healthy nonobese males (BMI <30) between 61 and 61 years old. [place], [place] [phone]. PMID: [id number].    Testosterone, Free 16.4  7.2 - 24.0 pg/mL  CBC     Status: Abnormal   Collection Time: 08/05/20  8:14 AM  Result Value Ref Range   WBC 9.2 3.4 - 10.8 x10E3/uL   RBC 5.84 (H) 4.14 - 5.80 x10E6/uL   Hemoglobin 18.7 (H) 13.0 - 17.7 g/dL   Hematocrit 55.3 (H) 37.5 - 51.0 %   MCV 95 79 - 97 fL   MCH 32.0 26.6 - 33.0 pg   MCHC 33.8 31.5 - 35.7 g/dL   RDW 13.5 11.6 - 15.4 %   Platelets 192 150 - 450 x10E3/uL  PSA     Status: None   Collection Time: 08/05/20  8:14 AM  Result Value Ref Range   Prostate Specific Ag, Serum 3.4 0.0 - 4.0 ng/mL    Comment: Roche ECLIA methodology. According to the American Urological Association, Serum PSA should decrease and remain at undetectable levels after radical prostatectomy. The AUA defines biochemical recurrence as an initial PSA value 0.2 ng/mL or greater followed by a subsequent confirmatory PSA value 0.2 ng/mL or greater. Values obtained with different assay methods or kits cannot be used interchangeably. Results cannot be interpreted as absolute evidence of the presence or absence of malignant disease.      BMET No results for input(s): NA, K, CL, CO2, GLUCOSE, BUN, CREATININE, CALCIUM in the last 72 hours. PT/INR No results for input(s): LABPROT, INR in the last 72 hours. ABG No results for input(s): PHART, HCO3 in the last 72 hours.  Invalid input(s): PCO2, PO2  Studies/Results: I have reviewed records and labs from Dr. Lajuana Ripple.    Assessment/Plan: Hypogonadism:  He has been doing better on IM testosterone but is getting the injections monthly.  I am going to have him change his dosing to 50mg  weekly from 200mg  monthly to give him a more even level.   I will get a testosterone level today and in 3  months.  Secondary erythrocytosis.  His Hgb is up to 18.7.  He had run borderline high for sometime.   I will repeat the level today and if it is up further, I will have him hold the testosterone until we can get it down.  I have recommended he go ahead and get set up for blood donation and do that regularly if able.  Elevated PSA.  His PSA was up to 3.4 in 4/22 from 2.0 in 4/21 and 1.5 in 6/17.   I will get a repeat today and if there is further increase, we will need to consider further evaluation.  His exam was benign.    No orders of the defined types were placed in this encounter.    Orders Placed This Encounter  Procedures   Urinalysis, Routine w reflex microscopic   Testosterone   Hemoglobin and hematocrit, blood   Hemoglobin and hematocrit, blood    Standing Status:   Future    Standing Expiration Date:   01/08/2021   Testosterone    Standing Status:   Future    Standing Expiration Date:   04/09/2021   Hemoglobin and hematocrit, blood    Standing Status:   Future    Standing Expiration Date:   04/09/2021     Return in about 3 months (around 01/08/2021) for See lab orders..    CC: Dr. Adam Phenix.      Jorge [name] 10/08/2020 [phone]

## 2020-10-09 ENCOUNTER — Telehealth: Payer: Self-pay

## 2020-10-09 LAB — HEMOGLOBIN AND HEMATOCRIT, BLOOD
Hematocrit: 55 % — ABNORMAL HIGH (ref 37.5–51.0)
Hemoglobin: 18.6 g/dL — ABNORMAL HIGH (ref 13.0–17.7)

## 2020-10-09 LAB — TESTOSTERONE: Testosterone: 282 ng/dL (ref 264–916)

## 2020-10-14 NOTE — Telephone Encounter (Signed)
Opened in error

## 2020-11-11 ENCOUNTER — Other Ambulatory Visit: Payer: Self-pay

## 2020-11-11 ENCOUNTER — Encounter: Payer: Self-pay | Admitting: *Deleted

## 2020-11-11 ENCOUNTER — Ambulatory Visit (INDEPENDENT_AMBULATORY_CARE_PROVIDER_SITE_OTHER): Payer: 59 | Admitting: Family Medicine

## 2020-11-11 VITALS — BP 148/74 | HR 70 | Ht 65.0 in | Wt 158.0 lb

## 2020-11-11 DIAGNOSIS — R200 Anesthesia of skin: Secondary | ICD-10-CM

## 2020-11-11 DIAGNOSIS — R202 Paresthesia of skin: Secondary | ICD-10-CM | POA: Diagnosis not present

## 2020-11-11 DIAGNOSIS — R030 Elevated blood-pressure reading, without diagnosis of hypertension: Secondary | ICD-10-CM

## 2020-11-11 DIAGNOSIS — F411 Generalized anxiety disorder: Secondary | ICD-10-CM | POA: Diagnosis not present

## 2020-11-11 MED ORDER — ESCITALOPRAM OXALATE 10 MG PO TABS: 10.0000 mg | ORAL_TABLET | Freq: Every day | ORAL | 0 refills | Status: DC

## 2020-11-11 NOTE — Patient Instructions (Signed)
How to Take Your Blood Pressure Blood pressure measures how strongly your blood is pressing against the walls of your arteries. Arteries are blood vessels that carry blood from your heartthroughout your body. You can take your blood pressure at home with a machine. You may need to check your blood pressure at home: To check if you have high blood pressure (hypertension). To check your blood pressure over time. To make sure your blood pressure medicine is working. Supplies needed: Blood pressure machine, or monitor. Dining room chair to sit in. Table or desk. Small notebook. Pencil or pen. How to prepare Avoid these things for 30 minutes before checking your blood pressure: Having drinks with caffeine in them, such as coffee or tea. Drinking alcohol. Eating. Smoking. Exercising. Do these things five minutes before checking your blood pressure: Go to the bathroom and pee (urinate). Sit in a dining chair. Do not sit on a soft couch or an armchair. Be quiet. Do not talk. How to take your blood pressure Follow the instructions that came with your machine. If you have a digital blood pressure monitor, these may be the instructions: Sit up straight. Place your feet on the floor. Do not cross your ankles or legs. Rest your left arm at the level of your heart. You may rest it on a table, desk, or chair. Pull up your shirt sleeve. Wrap the blood pressure cuff around the upper part of your left arm. The cuff should be 1 inch (2.5 cm) above your elbow. It is best to wrap the cuff around bare skin. Fit the cuff snugly around your arm. You should be able to place only one finger between the cuff and your arm. Place the cord so that it rests in the bend of your elbow. Press the power button. Sit quietly while the cuff fills with air and loses air. Write down the numbers on the screen. Wait 2-3 minutes and then repeat steps 1-10. What do the numbers mean? Two numbers make up your blood pressure.  The first number is called systolic pressure. The second is called diastolic pressure. An example of a bloodpressure reading is "120 over 80" (or 120/80). If you are an adult and do not have a medical condition, use this guide to findout if your blood pressure is normal: Normal First number: below 120. Second number: below 80. Elevated First number: 120-129. Second number: below 80. Hypertension stage 1 First number: 130-139. Second number: 80-89. Hypertension stage 2 First number: 140 or above. Second number: 66 or above. Your blood pressure is above normal even if only the first or only the secondnumber is above normal. Follow these instructions at home: Medicines Take over-the-counter and prescription medicines only as told by your doctor. Tell your doctor if your medicine is causing side effects. General instructions Check your blood pressure as often as your doctor tells you to. Check your blood pressure at the same time every day. Take your monitor to your next doctor's appointment. Your doctor will: Make sure you are using it correctly. Make sure it is working right. Understand what your blood pressure numbers should be. Keep all follow-up visits as told by your doctor. This is important. General tips You will need a blood pressure machine, or monitor. Your doctor can suggest a monitor. You can buy one at a drugstore or online. When choosing one: Choose one with an arm cuff. Choose one that wraps around your upper arm. Only one finger should fit between your arm and the  cuff. Do not choose one that measures your blood pressure from your wrist or finger. Where to find more information American Heart Association: www.heart.org Contact a doctor if: Your blood pressure keeps being high. Your blood pressure is suddenly low. Get help right away if: Your first blood pressure number is higher than 180. Your second blood pressure number is higher than 120. Summary Check your  blood pressure at the same time every day. Avoid caffeine, alcohol, smoking, and exercise for 30 minutes before checking your blood pressure. Make sure you understand what your blood pressure numbers should be. This information is not intended to replace advice given to you by your health care provider. Make sure you discuss any questions you have with your healthcare provider. Document Revised: 02/12/2020 Document Reviewed: 03/29/2019 Elsevier Patient Education  2022 [place] Syndrome  Carpal tunnel syndrome is a condition that causes pain, weakness, and numbness in your hand and arm. Numbness is when you cannot feel an area in your body. The carpal tunnel is a narrow area that is on the palm side of your wrist. Repeated wrist motion or certain diseases may cause swelling in the tunnel. This swelling can pinch the main nerve in the wrist. This nerve is called themedian nerve. What are the causes? This condition may be caused by: Moving your hand and wrist over and over again while doing a task. Injury to the wrist. Arthritis. A sac of fluid (cyst) or abnormal growth (tumor) in the carpal tunnel. Fluid buildup during pregnancy. Use of tools that vibrate. Sometimes the cause is not known. What increases the risk? The following factors may make you more likely to have this condition: Having a job that makes you do these things: Move your hand over and over again. Work with tools that vibrate, such as drills or sanders. Being a woman. Having diabetes, obesity, thyroid problems, or kidney failure. What are the signs or symptoms? Symptoms of this condition include: A tingling feeling in your fingers. Tingling or loss of feeling in your hand. Pain in your entire arm. This pain may get worse when you bend your wrist and elbow for a long time. Pain in your wrist that goes up your arm to your shoulder. Pain that goes down into your palm or fingers. Weakness in your hands.  You may find it hard to grab and hold items. You may feel worse at night. How is this treated? This condition may be treated with: Lifestyle changes. You will be asked to stop or change the activity that caused your problem. Doing exercises and activities that make bones, muscles, and tendons stronger (physical therapy). Learning how to use your hand again (occupational therapy). Medicines for pain and swelling. You may have injections in your wrist. A wrist splint or brace. Surgery. Follow these instructions at home: If you have a splint or brace: Wear the splint or brace as told by your doctor. Take it off only as told by your doctor. Loosen the splint if your fingers: Tingle. Become numb. Turn cold and blue. Keep the splint or brace clean. If the splint or brace is not waterproof: Do not let it get wet. Cover it with a watertight covering when you take a bath or a shower. Managing pain, stiffness, and swelling If told, put ice on the painful area: If you have a removable splint or brace, remove it as told by your doctor. Put ice in a plastic bag. Place a towel between your skin and the  bag. Leave the ice on for 20 minutes, 2-3 times per day. Do not fall asleep with the cold pack on your skin. Take off the ice if your skin turns bright red. This is very important. If you cannot feel pain, heat, or cold, you have a greater risk of damage to the area. Move your fingers often to reduce stiffness and swelling. General instructions Take over-the-counter and prescription medicines only as told by your doctor. Rest your wrist from any activity that may cause pain. If needed, talk with your boss at work about changes that can help your wrist heal. Do exercises as told by your doctor, physical therapist, or occupational therapist. Keep all follow-up visits. Contact a doctor if: You have new symptoms. Medicine does not help your pain. Your symptoms get worse. Get help right away  if: You have very bad numbness or tingling in your wrist or hand. Summary Carpal tunnel syndrome is a condition that causes pain in your hand and arm. It is often caused by repeated wrist motions. Lifestyle changes and medicines are used to treat this problem. Surgery may help in very bad cases. Follow your doctor's instructions about wearing a splint, resting your wrist, keeping follow-up visits, and calling for help. This information is not intended to replace advice given to you by your health care provider. Make sure you discuss any questions you have with your healthcare provider. Document Revised: 08/15/2019 Document Reviewed: 08/15/2019 Elsevier Patient Education  [place].

## 2020-11-11 NOTE — Progress Notes (Signed)
Subjective: CC: high BP PCP: Jorge Norlander, DO [id number] [name] is a 60 y.o. male presenting to clinic today for:  1.  Elevated blood pressure readings Patient is accompanied today's visit by his wife.  He notes that over the last several days has been having elevations in his blood pressure.  He measured anywhere between [id number] and 0000000 systolic with diastolics typically in the 70s.  He notes that he gets butterflies in his stomach and this is becoming more consistent as of late.  He gave blood in July and his blood pressure at that time was 190/105.  In fact he became worried and started taking a full tablet of atenolol 25 mg rather than his half tablet because of these elevations.  No chest pain or shortness of breath reported.  No diaphoresis or nausea or vomiting.  He does admit to some left shoulder pain and left knee pain with occasional left hand numbness.  The hand numbness seems to be more prominent after waking but sometimes occurs after he has been doing work activities.   ROS: Per HPI  Allergies  Allergen Reactions   Penicillins Rash   Past Medical History:  Diagnosis Date   Abnormal liver enzymes    Duodenitis    Gout    Hemorrhoids    Hiatal hernia    Hyperlipidemia    Hypertension    Kidney stones    Testosterone deficiency    Tubular adenoma of colon     Current Outpatient Medications:    atenolol (TENORMIN) 25 MG tablet, Take 1 tablet (25 mg total) by mouth daily. (Patient taking differently: Take 12.5 mg by mouth daily.), Disp: 90 tablet, Rfl: 3   cholecalciferol (VITAMIN D) 1000 UNITS tablet, Take 2,000 Units by mouth daily., Disp: , Rfl:    levocetirizine (XYZAL) 5 MG tablet, Take 1 tablet (5 mg total) by mouth every evening., Disp: 30 tablet, Rfl: 2   Multiple Vitamins-Minerals (MULTIVITAMIN ADULT PO), Take by mouth., Disp: , Rfl:    omeprazole (PRILOSEC OTC) 20 MG tablet, Take 1 tablet (20 mg total) by mouth daily., Disp: 90 tablet, Rfl: 1    rosuvastatin (CRESTOR) 40 MG tablet, TAKE ONE (1) TABLET EACH DAY, Disp: 180 tablet, Rfl: 1   sildenafil (VIAGRA) 100 MG tablet, Take 0.5-1 tablets (50-100 mg total) by mouth daily as needed for erectile dysfunction., Disp: 5 tablet, Rfl: 11   testosterone cypionate (DEPOTESTOSTERONE CYPIONATE) 200 MG/ML injection, Inject 1 mL (200 mg total) into the muscle every 28 (twenty-eight) days., Disp: 10 mL, Rfl: 1   Turmeric (QC TUMERIC COMPLEX PO), Take by mouth., Disp: , Rfl:    vitamin C (ASCORBIC ACID) 500 MG tablet, Take 500 mg by mouth daily., Disp: , Rfl:   Current Facility-Administered Medications:    testosterone cypionate (DEPOTESTOSTERONE CYPIONATE) injection 200 mg, 200 mg, Intramuscular, Q28 days, Hassell Done, [name], FNP, 200 mg at 08/24/20 1545 Social History   Socioeconomic History   Marital status: Married    Spouse name: Not on file   Number of children: 1   Years of education: Not on file   Highest education level: Not on file  Occupational History   Occupation: Management  Tobacco Use   Smoking status: Never   Smokeless tobacco: Never  Substance and Sexual Activity   Alcohol use: No   Drug use: No   Sexual activity: Not on file  Other Topics Concern   Not on file  Social History Narrative   Not on file  Social Determinants of Health   Financial Resource Strain: Not on file  Food Insecurity: Not on file  Transportation Needs: Not on file  Physical Activity: Not on file  Stress: Not on file  Social Connections: Not on file  Intimate Partner Violence: Not on file   Family History  Problem Relation Age of Onset   Heart disease Mother 26       Heart Valve Disease   Hypertension Mother    Diabetes Father    Colon cancer Father     Objective: Office vital signs reviewed. BP (!) 148/74   Pulse 70   Ht '5\' 5"'$  (1.651 m)   Wt 158 lb (71.7 kg)   SpO2 97%   BMI 26.29 kg/m   Physical Examination:  General: Awake, alert, well nourished, No acute  distress HEENT: Normal. Sclera white, MMM Cardio: regular rate and rhythm, S1S2 heard, no murmurs appreciated Pulm: clear to auscultation bilaterally, no wheezes, rhonchi or rales; normal work of breathing on room air MSK: Ambulating independently with normal gait and station.  He has negative Phalen's, negative Tinel's. Psych: Somewhat restless and worried during exam but otherwise very pleasant, interactive.  Good eye contact.  Depression screen [facility] 2/9 11/11/2020 08/04/2020 05/19/2020  Decreased Interest 0 0 0  Down, Depressed, Hopeless 0 0 0  PHQ - 2 Score 0 0 0  Altered sleeping - - -  Tired, decreased energy - - -  Change in appetite - - -  Feeling bad or failure about yourself  - - -  Trouble concentrating - - -  Moving slowly or fidgety/restless - - -  Suicidal thoughts - - -  PHQ-9 Score - - -   No flowsheet data found.  Assessment/ Plan: 60 y.o. male   Generalized anxiety disorder - Plan: escitalopram (LEXAPRO) 10 MG tablet  Elevated blood pressure reading  Numbness and tingling in left hand  Start Lexapro 10 mg daily.  I do wonder if his elevated blood pressure readings have been situational and precipitated by anxiety.  His blood pressure is only very slightly above goal today.  Would like him to monitor blood pressures closely at home, continue atenolol 25 mg daily.  We will plan to recheck in about 4 to 6 weeks  The numbness and tingling that he is getting intermittently in the left hand seems consistent with a nerve irritation.  He did not have a positive Tinel's nor Phalen's today.  I did recommend bracing his wrist to see if this would improve however.  No orders of the defined types were placed in this encounter.  No orders of the defined types were placed in this encounter.    Jorge Norlander, DO [place] [phone]

## 2020-12-11 ENCOUNTER — Ambulatory Visit (INDEPENDENT_AMBULATORY_CARE_PROVIDER_SITE_OTHER): Payer: 59 | Admitting: Family Medicine

## 2020-12-11 ENCOUNTER — Other Ambulatory Visit: Payer: Self-pay

## 2020-12-11 ENCOUNTER — Encounter: Payer: Self-pay | Admitting: Family Medicine

## 2020-12-11 VITALS — BP 133/66 | HR 63 | Temp 97.5°F | Ht 65.0 in | Wt 158.6 lb

## 2020-12-11 DIAGNOSIS — R030 Elevated blood-pressure reading, without diagnosis of hypertension: Secondary | ICD-10-CM | POA: Diagnosis not present

## 2020-12-11 DIAGNOSIS — F411 Generalized anxiety disorder: Secondary | ICD-10-CM | POA: Diagnosis not present

## 2020-12-11 NOTE — Progress Notes (Signed)
Subjective: CC: Elevated blood pressure reading, anxiety PCP: [name], DO [id number] Jorge [name] is a 60 y.o. male presenting to clinic today for:  1.  Elevated blood pressure readings Patient reports that she has been monitoring blood pressures once daily.  Blood pressures have typically run 130s over 60s to 70s.  He had 1 outlier on 2 August of 158/82.  Blood pressure prior to arrival was 135/66.  2.  Anxiety disorder Patient does not feel any different on the Lexapro 10 mg but does feel that his wife feels that he is doing a little better.  His sex life has improved.  He seems pretty happy about this.   ROS: Per HPI  Allergies  Allergen Reactions   Penicillins Rash   Past Medical History:  Diagnosis Date   Abnormal liver enzymes    Duodenitis    Gout    Hemorrhoids    Hiatal hernia    Hyperlipidemia    Hypertension    Kidney stones    Testosterone deficiency    Tubular adenoma of colon     Current Outpatient Medications:    atenolol (TENORMIN) 25 MG tablet, Take 1 tablet (25 mg total) by mouth daily. (Patient taking differently: Take 12.5 mg by mouth daily.), Disp: 90 tablet, Rfl: 3   cholecalciferol (VITAMIN D) 1000 UNITS tablet, Take 2,000 Units by mouth daily., Disp: , Rfl:    escitalopram (LEXAPRO) 10 MG tablet, Take 1 tablet (10 mg total) by mouth daily., Disp: 90 tablet, Rfl: 0   levocetirizine (XYZAL) 5 MG tablet, Take 1 tablet (5 mg total) by mouth every evening., Disp: 30 tablet, Rfl: 2   Multiple Vitamins-Minerals (MULTIVITAMIN ADULT PO), Take by mouth., Disp: , Rfl:    omeprazole (PRILOSEC OTC) 20 MG tablet, Take 1 tablet (20 mg total) by mouth daily., Disp: 90 tablet, Rfl: 1   rosuvastatin (CRESTOR) 40 MG tablet, TAKE ONE (1) TABLET EACH DAY, Disp: 180 tablet, Rfl: 1   sildenafil (VIAGRA) 100 MG tablet, Take 0.5-1 tablets (50-100 mg total) by mouth daily as needed for erectile dysfunction., Disp: 5 tablet, Rfl: 11   testosterone cypionate  (DEPOTESTOSTERONE CYPIONATE) 200 MG/ML injection, Inject 1 mL (200 mg total) into the muscle every 28 (twenty-eight) days., Disp: 10 mL, Rfl: 1   Turmeric (QC TUMERIC COMPLEX PO), Take by mouth., Disp: , Rfl:    vitamin C (ASCORBIC ACID) 500 MG tablet, Take 500 mg by mouth daily., Disp: , Rfl:  Social History   Socioeconomic History   Marital status: Married    Spouse name: Not on file   Number of children: 1   Years of education: Not on file   Highest education level: Not on file  Occupational History   Occupation: Management  Tobacco Use   Smoking status: Never   Smokeless tobacco: Never  Substance and Sexual Activity   Alcohol use: No   Drug use: No   Sexual activity: Not on file  Other Topics Concern   Not on file  Social History Narrative   Not on file   Social Determinants of Health   Financial Resource Strain: Not on file  Food Insecurity: Not on file  Transportation Needs: Not on file  Physical Activity: Not on file  Stress: Not on file  Social Connections: Not on file  Intimate Partner Violence: Not on file   Family History  Problem Relation Age of Onset   Heart disease Mother 65       Heart Valve Disease  Hypertension Mother    Diabetes Father    Colon cancer Father     Objective: Office vital signs reviewed. BP 133/66 Comment: home BP  Pulse 63   Temp (!) 97.5 F (36.4 C)   Ht '5\' 5"'$  (1.651 m)   Wt 158 lb 9.6 oz (71.9 kg)   SpO2 95%   BMI 26.39 kg/m   Physical Examination:  General: Awake, alert, well nourished, No acute distress Cardio: regular rate and rhythm, S1S2 heard, no murmurs appreciated Pulm: clear to auscultation bilaterally, no wheezes, rhonchi or rales; normal work of breathing on room air Psych: Mood is stable.  Patient is pleasant and interactive  Depression screen [facility] 2/9 12/11/2020 11/11/2020 08/04/2020  Decreased Interest 0 0 0  Down, Depressed, Hopeless 0 0 0  PHQ - 2 Score 0 0 0  Altered sleeping - - -  Tired, decreased  energy - - -  Change in appetite - - -  Feeling bad or failure about yourself  - - -  Trouble concentrating - - -  Moving slowly or fidgety/restless - - -  Suicidal thoughts - - -  PHQ-9 Score - - -   GAD 7 : Generalized Anxiety Score 12/11/2020  Nervous, Anxious, on Edge 0  Control/stop worrying 0  Worry too much - different things 0  Trouble relaxing 0  Restless 0  Easily annoyed or irritable 0  Afraid - awful might happen 0  Total GAD 7 Score 0  Anxiety Difficulty Not difficult at all     Assessment/ Plan: 60 y.o. male   Generalized anxiety disorder  White coat syndrome with high blood pressure without hypertension  Anxiety syndrome seems to be stable.  I think that his elevated blood pressure readings are consistent with whitecoat syndrome as his blood pressures at home are normotensive.  He may follow-up in 4 to 6 months, sooner if needed  No orders of the defined types were placed in this encounter.  No orders of the defined types were placed in this encounter.    [name], DO [place] [phone]

## 2020-12-31 ENCOUNTER — Other Ambulatory Visit: Payer: 59

## 2020-12-31 ENCOUNTER — Other Ambulatory Visit: Payer: Self-pay

## 2020-12-31 DIAGNOSIS — R7989 Other specified abnormal findings of blood chemistry: Secondary | ICD-10-CM

## 2020-12-31 DIAGNOSIS — D751 Secondary polycythemia: Secondary | ICD-10-CM

## 2021-01-01 LAB — HEMOGLOBIN AND HEMATOCRIT, BLOOD
Hematocrit: 49.5 % (ref 37.5–51.0)
Hemoglobin: 16.5 g/dL (ref 13.0–17.7)

## 2021-01-01 LAB — TESTOSTERONE: Testosterone: 523 ng/dL (ref 264–916)

## 2021-01-07 ENCOUNTER — Ambulatory Visit (INDEPENDENT_AMBULATORY_CARE_PROVIDER_SITE_OTHER): Payer: 59 | Admitting: Urology

## 2021-01-07 ENCOUNTER — Other Ambulatory Visit: Payer: Self-pay

## 2021-01-07 VITALS — BP 155/80 | HR 60 | Temp 98.6°F | Ht 65.0 in | Wt 163.0 lb

## 2021-01-07 DIAGNOSIS — N5201 Erectile dysfunction due to arterial insufficiency: Secondary | ICD-10-CM | POA: Diagnosis not present

## 2021-01-07 DIAGNOSIS — R972 Elevated prostate specific antigen [PSA]: Secondary | ICD-10-CM

## 2021-01-07 DIAGNOSIS — D751 Secondary polycythemia: Secondary | ICD-10-CM | POA: Diagnosis not present

## 2021-01-07 DIAGNOSIS — R7989 Other specified abnormal findings of blood chemistry: Secondary | ICD-10-CM

## 2021-01-07 LAB — MICROSCOPIC EXAMINATION
Bacteria, UA: NONE SEEN
Epithelial Cells (non renal): NONE SEEN /hpf (ref 0–10)
RBC, Urine: NONE SEEN /hpf (ref 0–2)
Renal Epithel, UA: NONE SEEN /hpf
WBC, UA: NONE SEEN /hpf (ref 0–5)

## 2021-01-07 LAB — URINALYSIS, ROUTINE W REFLEX MICROSCOPIC
Bilirubin, UA: NEGATIVE
Glucose, UA: NEGATIVE
Ketones, UA: NEGATIVE
Leukocytes,UA: NEGATIVE
Nitrite, UA: NEGATIVE
Protein,UA: NEGATIVE
Specific Gravity, UA: 1.02 (ref 1.005–1.030)
Urobilinogen, Ur: 0.2 mg/dL (ref 0.2–1.0)
pH, UA: 5.5 (ref 5.0–7.5)

## 2021-01-07 NOTE — Progress Notes (Signed)
Urological Symptom Review  Patient is experiencing the following symptoms: Frequent urination Kidney stones   Review of Systems  Gastrointestinal (upper)  : Indigestion/heartburn  Gastrointestinal (lower) : Negative for lower GI symptoms  Constitutional : Negative for symptoms  Skin: Negative for skin symptoms  Eyes: Negative for eye symptoms  Ear/Nose/Throat : Negative for Ear/Nose/Throat symptoms  Hematologic/Lymphatic: Negative for Hematologic/Lymphatic symptoms  Cardiovascular : Negative for cardiovascular symptoms  Respiratory : Negative for respiratory symptoms  Endocrine: Negative for endocrine symptoms  Musculoskeletal: Negative for musculoskeletal symptoms  Neurological: Negative for neurological symptoms  Psychologic: Negative for psychiatric symptoms

## 2021-01-07 NOTE — Progress Notes (Signed)
Subjective: 1. Low testosterone   2. Polycythemia, secondary   3. Erectile dysfunction due to arterial insufficiency   4. Elevated PSA      01/07/21: Jorge [name] [name] returns today in f/u for the history below.  He has hypogonadism on TRT with acquired polycythemia and he has been set up for a q56 day schedule for phlebotomy.  His Hgb is down from 18.6 to 16.5.  His testosterone is 523.  The PSA that was ordered wasn't done.   He improved erectile function, energy and drive.  He remains on sildenafil.    Jorge [name] [name] is a 60 yo male who is sent by Dr. Lajuana Ripple for low testosterone.   He was initially on androgel for a year but didn't improve with fatigue and reduced libido.   He was changed to IM testosterone and is getting 200mg  IM monthly.   His most recent T was 477 in 4/22.  It was 121 in 10/21.   His Hgb has been rising on therapy but is somewhat high at baseline.   His most recent was 18.7 with a 55.3% HCT.   He has ED and responds to 50mg  Viagra.  He will lose the erection but get it back in 5 min.  He is a never smoker.   He is voiding well with an IPSS of 2.  HIs PSA has increased over time with a level of 1.5 in 6/17, 2.0 in 4/21 and 3.4 in 4/22.  ROS:  ROS  Allergies  Allergen Reactions   Penicillins Rash    Past Medical History:  Diagnosis Date   Abnormal liver enzymes    Duodenitis    Gout    Hemorrhoids    Hiatal hernia    Hyperlipidemia    Hypertension    Kidney stones    Testosterone deficiency    Tubular adenoma of colon     Past Surgical History:  Procedure Laterality Date   LITHOTRIPSY     VASECTOMY      Social History   Socioeconomic History   Marital status: Married    Spouse name: Not on file   Number of children: 1   Years of education: Not on file   Highest education level: Not on file  Occupational History   Occupation: Management  Tobacco Use   Smoking status: Never   Smokeless tobacco: Never  Substance and Sexual Activity   Alcohol use: No   Drug  use: No   Sexual activity: Not on file  Other Topics Concern   Not on file  Social History Narrative   Not on file   Social Determinants of Health   Financial Resource Strain: Not on file  Food Insecurity: Not on file  Transportation Needs: Not on file  Physical Activity: Not on file  Stress: Not on file  Social Connections: Not on file  Intimate Partner Violence: Not on file    Family History  Problem Relation Age of Onset   Heart disease Mother 72       Heart Valve Disease   Hypertension Mother    Diabetes Father    Colon cancer Father     Anti-infectives: Anti-infectives (From admission, onward)    None       Current Outpatient Medications  Medication Sig Dispense Refill   atenolol (TENORMIN) 25 MG tablet Take 1 tablet (25 mg total) by mouth daily. (Patient taking differently: Take 12.5 mg by mouth daily.) 90 tablet 3   cholecalciferol (VITAMIN D) 1000 UNITS tablet Take  2,000 Units by mouth daily.     escitalopram (LEXAPRO) 10 MG tablet Take 1 tablet (10 mg total) by mouth daily. 90 tablet 0   levocetirizine (XYZAL) 5 MG tablet Take 1 tablet (5 mg total) by mouth every evening. 30 tablet 2   Multiple Vitamins-Minerals (MULTIVITAMIN ADULT PO) Take by mouth.     omeprazole (PRILOSEC OTC) 20 MG tablet Take 1 tablet (20 mg total) by mouth daily. 90 tablet 1   rosuvastatin (CRESTOR) 40 MG tablet TAKE ONE (1) TABLET EACH DAY 180 tablet 1   sildenafil (VIAGRA) 100 MG tablet Take 0.5-1 tablets (50-100 mg total) by mouth daily as needed for erectile dysfunction. 5 tablet 11   testosterone cypionate (DEPOTESTOSTERONE CYPIONATE) 200 MG/ML injection Inject 1 mL (200 mg total) into the muscle every 28 (twenty-eight) days. 10 mL 1   Turmeric (QC TUMERIC COMPLEX PO) Take by mouth.     vitamin C (ASCORBIC ACID) 500 MG tablet Take 500 mg by mouth daily.     No current facility-administered medications for this visit.     Objective: Vital signs in last 24 hours: BP (!) 155/80  (BP Location: Left Arm, Patient Position: Sitting, Cuff Size: Normal)   Pulse 60   Temp 98.6 F (37 C)   Ht 5\' 5"  (1.651 m)   Wt 163 lb (73.9 kg)   BMI 27.12 kg/m   Intake/Output from previous day: No intake/output data recorded. Intake/Output this shift: @IOTHISSHIFT @   Physical Exam  Lab Results:  Recent Results (from the past 2160 hour(s))  Hemoglobin and hematocrit, blood     Status: None   Collection Time: 12/31/20  8:36 AM  Result Value Ref Range   Hemoglobin 16.5 13.0 - 17.7 g/dL   Hematocrit 49.5 37.5 - 51.0 %  Testosterone     Status: None   Collection Time: 12/31/20  8:36 AM  Result Value Ref Range   Testosterone 523 264 - 916 ng/dL    Comment: Adult male reference interval is based on a population of healthy nonobese males (BMI <30) between 67 and 60 years old. [place], [place] [phone]. PMID: 16010932.   Urinalysis, Routine w reflex microscopic     Status: Abnormal   Collection Time: 01/07/21  4:07 PM  Result Value Ref Range   Specific Gravity, UA 1.020 1.005 - 1.030   pH, UA 5.5 5.0 - 7.5   Color, UA Yellow Yellow   Appearance Ur Clear Clear   Leukocytes,UA Negative Negative   Protein,UA Negative Negative/Trace   Glucose, UA Negative Negative   Ketones, UA Negative Negative   RBC, UA Trace (A) Negative   Bilirubin, UA Negative Negative   Urobilinogen, Ur 0.2 0.2 - 1.0 mg/dL   Nitrite, UA Negative Negative   Microscopic Examination See below:   Microscopic Examination     Status: None   Collection Time: 01/07/21  4:07 PM   Urine  Result Value Ref Range   WBC, UA None seen 0 - 5 /hpf   RBC None seen 0 - 2 /hpf   Epithelial Cells (non renal) None seen 0 - 10 /hpf   Renal Epithel, UA None seen None seen /hpf   Bacteria, UA None seen None seen/Few     BMET No results for input(s): NA, K, CL, CO2, GLUCOSE, BUN, CREATININE, CALCIUM in the last 72 hours. PT/INR No results for input(s): LABPROT, INR in the last 72 hours. ABG No  results for input(s): PHART, HCO3 in the last 72 hours.  Invalid  input(s): PCO2, PO2  Studies/Results: I have reviewed records and labs from Dr. Lajuana Ripple.    Assessment/Plan: Hypogonadism:  He is doing well on the weekly injections with good energy, libido and sexual function.   His level was normal.   Secondary erythrocytosis.  His Hgb is 16.5 s/p phlebotomy and his on a schedule to have that done q56 days.  Elevated PSA.  His PSA was up to 3.4 in 4/22 from 2.0 in 4/21 and 1.5 in 6/17.   I will get a repeat today  since it didn't get done at his last visit and if there is further increase, we will need to consider further evaluation.  His exam was benign.    No orders of the defined types were placed in this encounter.    Orders Placed This Encounter  Procedures   Microscopic Examination   Urinalysis, Routine w reflex microscopic   PSA, total and free   Hemoglobin and hematocrit, blood    Standing Status:   Future    Standing Expiration Date:   01/07/2022   Testosterone    Standing Status:   Future    Standing Expiration Date:   01/07/2022   PSA    Standing Status:   Future    Standing Expiration Date:   01/07/2022     Return in about 6 months (around 07/07/2021) for with labs.    CC: Dr. Adam Phenix.      Irine Seal 01/07/2021 [phone] Patient ID: Jorge [name] [name], male   DOB: 1960-09-02, 60 y.o.   MRN: [id number]

## 2021-01-08 LAB — PSA, TOTAL AND FREE
PSA, Free Pct: 48.9 %
PSA, Free: 0.93 ng/mL
Prostate Specific Ag, Serum: 1.9 ng/mL (ref 0.0–4.0)

## 2021-01-11 NOTE — Progress Notes (Signed)
Results sent via my chart 

## 2021-01-14 ENCOUNTER — Other Ambulatory Visit: Payer: Self-pay | Admitting: Family Medicine

## 2021-01-14 DIAGNOSIS — F411 Generalized anxiety disorder: Secondary | ICD-10-CM

## 2021-01-14 DIAGNOSIS — I10 Essential (primary) hypertension: Secondary | ICD-10-CM

## 2021-01-14 DIAGNOSIS — R002 Palpitations: Secondary | ICD-10-CM

## 2021-01-26 ENCOUNTER — Ambulatory Visit (INDEPENDENT_AMBULATORY_CARE_PROVIDER_SITE_OTHER): Payer: 59

## 2021-01-26 ENCOUNTER — Other Ambulatory Visit: Payer: Self-pay

## 2021-01-26 DIAGNOSIS — Z23 Encounter for immunization: Secondary | ICD-10-CM

## 2021-03-16 ENCOUNTER — Encounter: Payer: Self-pay | Admitting: Internal Medicine

## 2021-04-07 ENCOUNTER — Other Ambulatory Visit: Payer: Self-pay | Admitting: Family Medicine

## 2021-04-07 DIAGNOSIS — I10 Essential (primary) hypertension: Secondary | ICD-10-CM

## 2021-04-07 DIAGNOSIS — R002 Palpitations: Secondary | ICD-10-CM

## 2021-04-07 DIAGNOSIS — F411 Generalized anxiety disorder: Secondary | ICD-10-CM

## 2021-06-03 ENCOUNTER — Other Ambulatory Visit: Payer: Self-pay | Admitting: Family Medicine

## 2021-06-03 DIAGNOSIS — E349 Endocrine disorder, unspecified: Secondary | ICD-10-CM

## 2021-06-03 NOTE — Telephone Encounter (Signed)
Last office visit 12/11/20 Last refill 11/05/19, 10 ml, 1 refill

## 2021-06-30 ENCOUNTER — Other Ambulatory Visit: Payer: 59

## 2021-07-08 ENCOUNTER — Ambulatory Visit: Payer: 59 | Admitting: Urology

## 2021-07-31 ENCOUNTER — Other Ambulatory Visit: Payer: Self-pay | Admitting: Family Medicine

## 2021-07-31 DIAGNOSIS — I10 Essential (primary) hypertension: Secondary | ICD-10-CM

## 2021-07-31 DIAGNOSIS — R002 Palpitations: Secondary | ICD-10-CM

## 2021-10-22 ENCOUNTER — Other Ambulatory Visit: Payer: Self-pay | Admitting: Family Medicine

## 2021-10-22 DIAGNOSIS — E7800 Pure hypercholesterolemia, unspecified: Secondary | ICD-10-CM

## 2021-10-22 DIAGNOSIS — I10 Essential (primary) hypertension: Secondary | ICD-10-CM

## 2021-10-22 DIAGNOSIS — R002 Palpitations: Secondary | ICD-10-CM

## 2021-10-22 NOTE — Telephone Encounter (Signed)
[facility]. NTBS 30 days given 08/02/21

## 2021-10-25 ENCOUNTER — Encounter: Payer: Self-pay | Admitting: Family Medicine

## 2021-10-25 NOTE — Telephone Encounter (Signed)
Letter sent. 10-25-2021

## 2021-11-18 ENCOUNTER — Other Ambulatory Visit: Payer: Self-pay | Admitting: Family Medicine

## 2021-11-18 DIAGNOSIS — N5201 Erectile dysfunction due to arterial insufficiency: Secondary | ICD-10-CM

## 2021-11-18 NOTE — Telephone Encounter (Signed)
[facility] patient Last office visit 12/11/20 Upcoming appointment 12/01/21 Last refill 02/04/20, #5, 11 refills

## 2021-11-29 ENCOUNTER — Encounter: Payer: Self-pay | Admitting: Family Medicine

## 2021-11-29 ENCOUNTER — Ambulatory Visit (INDEPENDENT_AMBULATORY_CARE_PROVIDER_SITE_OTHER): Payer: 59 | Admitting: Family Medicine

## 2021-11-29 VITALS — BP 149/73 | HR 71 | Temp 97.8°F | Ht 65.0 in | Wt 164.4 lb

## 2021-11-29 DIAGNOSIS — E7800 Pure hypercholesterolemia, unspecified: Secondary | ICD-10-CM | POA: Diagnosis not present

## 2021-11-29 DIAGNOSIS — I10 Essential (primary) hypertension: Secondary | ICD-10-CM

## 2021-11-29 DIAGNOSIS — E349 Endocrine disorder, unspecified: Secondary | ICD-10-CM

## 2021-11-29 DIAGNOSIS — F411 Generalized anxiety disorder: Secondary | ICD-10-CM | POA: Diagnosis not present

## 2021-11-29 DIAGNOSIS — M10071 Idiopathic gout, right ankle and foot: Secondary | ICD-10-CM

## 2021-11-29 DIAGNOSIS — R002 Palpitations: Secondary | ICD-10-CM

## 2021-11-29 MED ORDER — ESCITALOPRAM OXALATE 10 MG PO TABS: ORAL_TABLET | ORAL | 3 refills | Status: DC

## 2021-11-29 MED ORDER — CELECOXIB 200 MG PO CAPS
200.0000 mg | ORAL_CAPSULE | Freq: Every day | ORAL | 0 refills | Status: DC | PRN
Start: 2021-11-29 — End: 2022-11-18

## 2021-11-29 MED ORDER — ROSUVASTATIN CALCIUM 40 MG PO TABS: 40.0000 mg | ORAL_TABLET | Freq: Every day | ORAL | 3 refills | Status: DC

## 2021-11-29 MED ORDER — COLCHICINE 0.6 MG PO TABS: ORAL_TABLET | ORAL | 0 refills | Status: DC

## 2021-11-29 MED ORDER — ATENOLOL 25 MG PO TABS: 25.0000 mg | ORAL_TABLET | Freq: Every day | ORAL | 3 refills | Status: DC

## 2021-11-29 NOTE — Progress Notes (Signed)
Subjective: CC: Hypertension, hyperlipidemia PCP: [name], DO [id number] Jorge [name] is a 61 y.o. male presenting to clinic today for:  1.  Whitecoat syndrome associated with hypertension and hyperlipidemia Patient reports compliance with atenolol.  He will be running out at the end of the week.  No reports of chest pain, shortness of breath, dizziness.  Blood pressures at home running 130s over 70s.  He is asking for refills on colchicine and Celebrex 200 mg as this is of that he uses as needed gout flares.  Had a recent gout flare.  2.  Hypogonadism Continues to take testosterone weekly.  He is monitored by urology.  Apparently had some slight elevations in hemoglobin hematocrit recently but has been donating blood and his last levels were normal   ROS: Per HPI  Allergies  Allergen Reactions   Penicillins Rash   Past Medical History:  Diagnosis Date   Abnormal liver enzymes    Duodenitis    Gout    Hemorrhoids    Hiatal hernia    Hyperlipidemia    Hypertension    Kidney stones    Testosterone deficiency    Tubular adenoma of colon     Current Outpatient Medications:    atenolol (TENORMIN) 25 MG tablet, Take 1 tablet (25 mg total) by mouth daily. (NEEDS TO BE SEEN BEFORE NEXT REFILL), Disp: 30 tablet, Rfl: 0   cholecalciferol (VITAMIN D) 1000 UNITS tablet, Take 2,000 Units by mouth daily., Disp: , Rfl:    escitalopram (LEXAPRO) 10 MG tablet, TAKE ONE (1) TABLET BY MOUTH EVERY DAY, Disp: 90 tablet, Rfl: 0   levocetirizine (XYZAL) 5 MG tablet, Take 1 tablet (5 mg total) by mouth every evening., Disp: 30 tablet, Rfl: 2   Multiple Vitamins-Minerals (MULTIVITAMIN ADULT PO), Take by mouth., Disp: , Rfl:    omeprazole (PRILOSEC OTC) 20 MG tablet, Take 1 tablet (20 mg total) by mouth daily., Disp: 90 tablet, Rfl: 1   rosuvastatin (CRESTOR) 40 MG tablet, TAKE ONE (1) TABLET EACH DAY, Disp: 180 tablet, Rfl: 1   sildenafil (VIAGRA) 100 MG tablet, TAKE 1/2 TO 1 TABLET DAILY  AS NEEDED, Disp: 5 tablet, Rfl: 11   testosterone cypionate (DEPOTESTOSTERONE CYPIONATE) 200 MG/ML injection, INJECT 1ML IM EVERY 28 DAYS, Disp: 10 mL, Rfl: 1   Turmeric (QC TUMERIC COMPLEX PO), Take by mouth., Disp: , Rfl:    vitamin C (ASCORBIC ACID) 500 MG tablet, Take 500 mg by mouth daily., Disp: , Rfl:  Social History   Socioeconomic History   Marital status: Married    Spouse name: Not on file   Number of children: 1   Years of education: Not on file   Highest education level: Not on file  Occupational History   Occupation: Management  Tobacco Use   Smoking status: Never   Smokeless tobacco: Never  Substance and Sexual Activity   Alcohol use: No   Drug use: No   Sexual activity: Not on file  Other Topics Concern   Not on file  Social History Narrative   Not on file   Social Determinants of Health   Financial Resource Strain: Not on file  Food Insecurity: Not on file  Transportation Needs: Not on file  Physical Activity: Not on file  Stress: Not on file  Social Connections: Not on file  Intimate Partner Violence: Not on file   Family History  Problem Relation Age of Onset   Heart disease Mother 77       Heart  Valve Disease   Hypertension Mother    Diabetes Father    Colon cancer Father     Objective: Office vital signs reviewed. BP (!) 149/73   Pulse 71   Temp 97.8 F (36.6 C)   Ht _0  (1.651 m)   Wt 164 lb 6.4 oz (74.6 kg)   SpO2 95%   BMI 27.36 kg/m   Physical Examination:  General: Awake, alert, well nourished, No acute distress HEENT: Sclera white.  Moist mucous membranes Cardio: regular rate and rhythm, S1S2 heard, ?  Soft murmur murmurs appreciated Pulm: clear to auscultation bilaterally, no wheezes, rhonchi or rales; normal work of breathing on room air  Assessment/ Plan: 61 y.o. male   Generalized anxiety disorder - Plan: escitalopram (LEXAPRO) 10 MG tablet  Pure hypercholesterolemia - Plan: Lipid panel, CMP14+EGFR, rosuvastatin  (CRESTOR) 40 MG tablet  White coat syndrome with diagnosis of hypertension - Plan: atenolol (TENORMIN) 25 MG tablet  Testosterone deficiency - Plan: CBC  Palpitations - Plan: atenolol (TENORMIN) 25 MG tablet  Idiopathic gout of right ankle, unspecified chronicity - Plan: Uric acid, celecoxib (CELEBREX) 200 MG capsule, colchicine 0.6 MG tablet  Medications have been renewed per his request.  He has a physical scheduled me in a few weeks for fasting labs and wishes to have them collected at that time.    Blood pressures are running normal at home so I suspect that whitecoat syndrome is likely playing a role in his elevation in blood pressure today despite recheck.  Given previous elevations in hematocrit and hemoglobin we will plan for repeat CBC as well  Plan to check uric acid level at next visit.  Both Celebrex and colchicine sent for as needed use for gout flares  Orders Placed This Encounter  Procedures   Lipid panel    Standing Status:   Future    Standing Expiration Date:   11/30/2022   CMP14+EGFR    Standing Status:   Future    Standing Expiration Date:   11/30/2022   CBC    Standing Status:   Future    Standing Expiration Date:   11/30/2022   No orders of the defined types were placed in this encounter.    [name], DO [place] [phone]

## 2021-12-24 ENCOUNTER — Encounter: Payer: Self-pay | Admitting: Family Medicine

## 2021-12-24 ENCOUNTER — Ambulatory Visit (INDEPENDENT_AMBULATORY_CARE_PROVIDER_SITE_OTHER): Payer: 59 | Admitting: Family Medicine

## 2021-12-24 VITALS — BP 132/67 | HR 62 | Ht 65.0 in | Wt 158.8 lb

## 2021-12-24 DIAGNOSIS — I10 Essential (primary) hypertension: Secondary | ICD-10-CM

## 2021-12-24 DIAGNOSIS — Z0001 Encounter for general adult medical examination with abnormal findings: Secondary | ICD-10-CM | POA: Diagnosis not present

## 2021-12-24 DIAGNOSIS — Z0000 Encounter for general adult medical examination without abnormal findings: Secondary | ICD-10-CM

## 2021-12-24 NOTE — Patient Instructions (Addendum)
When you come in on Tuesday, make sure they get MY labs AND Dr Ralene Muskrat labs (that has your testosterone in them)!  Bring your BP cuff in to the Tuesday lab appt.  One of the triage nurses should be able to check your monitor against ours to see if it is reading normally.

## 2021-12-24 NOTE — Progress Notes (Signed)
Jorge [name] is a 61 y.o. male presents to office today for annual physical exam examination.    Concerns today include: 1.  None.  He is doing well.  Home BPs have been controlled in the 130s over 60s typically.  He is compliant with all meds.  He will come in on Tuesday for fasting labs.  Will be donating blood on Monday.  Continues to follow-up with urologist for testosterone.  No concerning urinary symptoms.  Marital status: Married  Diet: Typical American, Exercise: No structured Last eye exam: Up-to-date.  Eyes were actually improving Last dental exam: Up-to-date  Last colonoscopy: In the midst of scheduling Refills needed today: None Immunizations needed: Immunization History  Administered Date(s) Administered   Influenza Split 03/24/2009, 04/06/2011   Influenza, Seasonal, Injecte, Preservative Fre 12/29/2009, 03/28/2012   Influenza,inj,Quad PF,6+ Mos 03/29/2013, 02/13/2014, 02/24/2015, 03/02/2016, 02/14/2017, 03/02/2018, 01/18/2019, 02/04/2020, 01/26/2021   PFIZER(Purple Top)SARS-COV-2 Vaccination 07/05/2019, 07/30/2019, 03/11/2020   Td 11/17/2010   Tdap 11/17/2010, 11/03/2021     Past Medical History:  Diagnosis Date   Abnormal liver enzymes    Duodenitis    Gout    Hemorrhoids    Hiatal hernia    Hyperlipidemia    Hypertension    Kidney stones    Testosterone deficiency    Tubular adenoma of colon    Social History   Socioeconomic History   Marital status: Married    Spouse name: Not on file   Number of children: 1   Years of education: Not on file   Highest education level: Not on file  Occupational History   Occupation: Management  Tobacco Use   Smoking status: Never   Smokeless tobacco: Never  Substance and Sexual Activity   Alcohol use: No   Drug use: No   Sexual activity: Not on file  Other Topics Concern   Not on file  Social History Narrative   Not on file   Social Determinants of Health   Financial Resource Strain: Not on file  Food  Insecurity: Not on file  Transportation Needs: Not on file  Physical Activity: Not on file  Stress: Not on file  Social Connections: Not on file  Intimate Partner Violence: Not on file   Past Surgical History:  Procedure Laterality Date   LITHOTRIPSY     VASECTOMY     Family History  Problem Relation Age of Onset   Heart disease Mother 51       Heart Valve Disease   Hypertension Mother    Diabetes Father    Colon cancer Father     Current Outpatient Medications:    atenolol (TENORMIN) 25 MG tablet, Take 1 tablet (25 mg total) by mouth daily., Disp: 90 tablet, Rfl: 3   celecoxib (CELEBREX) 200 MG capsule, Take 1 capsule (200 mg total) by mouth daily as needed (gout flare/ pain)., Disp: 30 capsule, Rfl: 0   cholecalciferol (VITAMIN D) 1000 UNITS tablet, Take 2,000 Units by mouth daily., Disp: , Rfl:    colchicine 0.6 MG tablet, Take 2 tablets at onset of gout flare.  Then take 1 tablet daily until gout flare resolved., Disp: 30 tablet, Rfl: 0   escitalopram (LEXAPRO) 10 MG tablet, TAKE ONE (1) TABLET BY MOUTH EVERY DAY, Disp: 90 tablet, Rfl: 3   Multiple Vitamins-Minerals (MULTIVITAMIN ADULT PO), Take by mouth., Disp: , Rfl:    omeprazole (PRILOSEC OTC) 20 MG tablet, Take 1 tablet (20 mg total) by mouth daily., Disp: 90 tablet, Rfl: 1  rosuvastatin (CRESTOR) 40 MG tablet, Take 1 tablet (40 mg total) by mouth daily. TAKE ONE (1) TABLET EACH DAY, Disp: 90 tablet, Rfl: 3   sildenafil (VIAGRA) 100 MG tablet, TAKE 1/2 TO 1 TABLET DAILY AS NEEDED, Disp: 5 tablet, Rfl: 11   testosterone cypionate (DEPOTESTOSTERONE CYPIONATE) 200 MG/ML injection, INJECT 1ML IM EVERY 28 DAYS, Disp: 10 mL, Rfl: 1   Turmeric (QC TUMERIC COMPLEX PO), Take by mouth., Disp: , Rfl:    vitamin C (ASCORBIC ACID) 500 MG tablet, Take 500 mg by mouth daily., Disp: , Rfl:   Allergies  Allergen Reactions   Penicillins Rash     ROS: Review of Systems A comprehensive review of systems was negative.    Physical  exam BP 132/67 Comment: home BP  Pulse 62   Ht '5\' 5"'$  (1.651 m)   Wt 158 lb 12.8 oz (72 kg)   SpO2 95%   BMI 26.43 kg/m  General appearance: alert, cooperative, appears stated age, and no distress Head: Normocephalic, without obvious abnormality, atraumatic Eyes: negative findings: lids and lashes normal, conjunctivae and sclerae normal, corneas clear, and pupils equal, round, reactive to light and accomodation Ears: normal TM's and external ear canals both ears Nose: Nares normal. Septum midline. Mucosa normal. No drainage or sinus tenderness. Throat: lips, mucosa, and tongue normal; teeth and gums normal Neck: no adenopathy, no carotid bruit, supple, symmetrical, trachea midline, and thyroid not enlarged, symmetric, no tenderness/mass/nodules Back: symmetric, no curvature. ROM normal. No CVA tenderness. Lungs: clear to auscultation bilaterally Chest wall: no tenderness Heart: regular rate and rhythm, S1, S2 normal, no murmur, click, rub or gallop Abdomen: soft, non-tender; bowel sounds normal; no masses,  no organomegaly Extremities: extremities normal, atraumatic, no cyanosis or edema Pulses: 2+ and symmetric Skin: Skin color, texture, turgor normal. No rashes or lesions Lymph nodes: Cervical, supraclavicular, and axillary nodes normal. Neurologic: Grossly normal Psych: Mood stable, speech normal, affect appropriate     12/24/2021    3:13 PM 11/29/2021    1:55 PM 12/11/2020    4:14 PM  Depression screen PHQ 2/9  Decreased Interest 0 0 0  Down, Depressed, Hopeless 0 0 0  PHQ - 2 Score 0 0 0  Altered sleeping 0    Tired, decreased energy 0    Change in appetite 0    Feeling bad or failure about yourself  0    Trouble concentrating 0    Moving slowly or fidgety/restless 0    Suicidal thoughts 0    PHQ-9 Score 0    Difficult doing work/chores Not difficult at all        12/24/2021    3:13 PM 11/29/2021    1:55 PM 12/11/2020    4:14 PM  GAD 7 : Generalized Anxiety Score   Nervous, Anxious, on Edge 0 0 0  Control/stop worrying 0 0 0  Worry too much - different things 0 0 0  Trouble relaxing 0 0 0  Restless 0 0 0  Easily annoyed or irritable 0 0 0  Afraid - awful might happen 0 0 0  Total GAD 7 Score 0 0 0  Anxiety Difficulty Not difficult at all Not difficult at all Not difficult at all    Assessment/ Plan: Georjean Mode here for annual physical exam.   Annual physical exam  White coat syndrome with diagnosis of hypertension  Home blood pressure is normal.  He will bring his home blood pressure monitor to have calibrated against ours on Tuesday when  he comes in for fasting labs.  These have been preordered as of last visit.  He has medication refills through August of next year.  Colonoscopy is in the midst of being scheduled.  Declines vaccinations today.  Counseled on healthy lifestyle choices, including diet (rich in fruits, vegetables and lean meats and low in salt and simple carbohydrates) and exercise (at least 30 minutes of moderate physical activity daily).  Patient to follow up in 1 year for annual exam or sooner if needed.  Natalie Mceuen M. Lajuana Ripple, DO

## 2021-12-28 ENCOUNTER — Other Ambulatory Visit: Payer: 59

## 2021-12-28 DIAGNOSIS — E7800 Pure hypercholesterolemia, unspecified: Secondary | ICD-10-CM

## 2021-12-28 DIAGNOSIS — E349 Endocrine disorder, unspecified: Secondary | ICD-10-CM

## 2021-12-28 DIAGNOSIS — M10071 Idiopathic gout, right ankle and foot: Secondary | ICD-10-CM

## 2021-12-29 LAB — CMP14+EGFR
ALT: 31 IU/L (ref 0–44)
AST: 26 IU/L (ref 0–40)
Albumin/Globulin Ratio: 1.6 (ref 1.2–2.2)
Albumin: 4.2 g/dL (ref 3.8–4.9)
Alkaline Phosphatase: 62 IU/L (ref 44–121)
BUN/Creatinine Ratio: 10 (ref 9–20)
BUN: 10 mg/dL (ref 6–24)
Bilirubin Total: 0.3 mg/dL (ref 0.0–1.2)
CO2: 25 mmol/L (ref 20–29)
Calcium: 9.6 mg/dL (ref 8.7–10.2)
Chloride: 102 mmol/L (ref 96–106)
Creatinine, Ser: 1.02 mg/dL (ref 0.76–1.27)
Globulin, Total: 2.6 g/dL (ref 1.5–4.5)
Glucose: 117 mg/dL — ABNORMAL HIGH (ref 70–99)
Potassium: 4.5 mmol/L (ref 3.5–5.2)
Sodium: 140 mmol/L (ref 134–144)
Total Protein: 6.8 g/dL (ref 6.0–8.5)
eGFR: 85 mL/min/{1.73_m2} (ref >59)

## 2021-12-29 LAB — LIPID PANEL
Chol/HDL Ratio: 2.9 ratio (ref 0.0–5.0)
Cholesterol, Total: 105 mg/dL (ref 100–199)
HDL: 36 mg/dL — ABNORMAL LOW (ref >39)
LDL Chol Calc (NIH): 44 mg/dL (ref 0–99)
Triglycerides: 145 mg/dL (ref 0–149)
VLDL Cholesterol Cal: 25 mg/dL (ref 5–40)

## 2021-12-29 LAB — CBC
Hematocrit: 43.9 % (ref 37.5–51.0)
Hemoglobin: 14.4 g/dL (ref 13.0–17.7)
MCH: 29.6 pg (ref 26.6–33.0)
MCHC: 32.8 g/dL (ref 31.5–35.7)
MCV: 90 fL (ref 79–97)
Platelets: 179 10*3/uL (ref 150–450)
RBC: 4.86 x10E6/uL (ref 4.14–5.80)
RDW: 13.1 % (ref 11.6–15.4)
WBC: 6.7 10*3/uL (ref 3.4–10.8)

## 2021-12-29 LAB — URIC ACID: Uric Acid: 7.6 mg/dL (ref 3.8–8.4)

## 2021-12-30 ENCOUNTER — Other Ambulatory Visit: Payer: 59

## 2021-12-30 LAB — SPECIMEN STATUS REPORT

## 2021-12-30 LAB — HGB A1C W/O EAG: Hgb A1c MFr Bld: 6 % — ABNORMAL HIGH (ref 4.8–5.6)

## 2021-12-31 LAB — TESTOSTERONE: Testosterone: 351 ng/dL (ref 264–916)

## 2022-02-11 ENCOUNTER — Ambulatory Visit: Payer: 59

## 2022-02-16 ENCOUNTER — Ambulatory Visit (INDEPENDENT_AMBULATORY_CARE_PROVIDER_SITE_OTHER): Payer: 59

## 2022-02-16 DIAGNOSIS — Z23 Encounter for immunization: Secondary | ICD-10-CM

## 2022-08-27 ENCOUNTER — Other Ambulatory Visit: Payer: Self-pay | Admitting: Family Medicine

## 2022-08-27 DIAGNOSIS — R002 Palpitations: Secondary | ICD-10-CM

## 2022-08-27 DIAGNOSIS — I10 Essential (primary) hypertension: Secondary | ICD-10-CM

## 2022-10-29 ENCOUNTER — Other Ambulatory Visit: Payer: Self-pay | Admitting: Family Medicine

## 2022-10-29 DIAGNOSIS — I10 Essential (primary) hypertension: Secondary | ICD-10-CM

## 2022-10-29 DIAGNOSIS — R002 Palpitations: Secondary | ICD-10-CM

## 2022-11-18 ENCOUNTER — Encounter: Payer: Self-pay | Admitting: Family Medicine

## 2022-11-18 ENCOUNTER — Ambulatory Visit: Payer: 59 | Admitting: Family Medicine

## 2022-11-18 VITALS — BP 154/78 | HR 51 | Temp 99.4°F | Ht 65.0 in | Wt 158.0 lb

## 2022-11-18 DIAGNOSIS — E349 Endocrine disorder, unspecified: Secondary | ICD-10-CM

## 2022-11-18 DIAGNOSIS — Z23 Encounter for immunization: Secondary | ICD-10-CM | POA: Diagnosis not present

## 2022-11-18 DIAGNOSIS — E559 Vitamin D deficiency, unspecified: Secondary | ICD-10-CM

## 2022-11-18 DIAGNOSIS — M10071 Idiopathic gout, right ankle and foot: Secondary | ICD-10-CM

## 2022-11-18 DIAGNOSIS — Z6826 Body mass index (BMI) 26.0-26.9, adult: Secondary | ICD-10-CM

## 2022-11-18 DIAGNOSIS — Z0000 Encounter for general adult medical examination without abnormal findings: Secondary | ICD-10-CM

## 2022-11-18 DIAGNOSIS — N5201 Erectile dysfunction due to arterial insufficiency: Secondary | ICD-10-CM

## 2022-11-18 DIAGNOSIS — E7800 Pure hypercholesterolemia, unspecified: Secondary | ICD-10-CM

## 2022-11-18 DIAGNOSIS — Z79899 Other long term (current) drug therapy: Secondary | ICD-10-CM

## 2022-11-18 DIAGNOSIS — R7303 Prediabetes: Secondary | ICD-10-CM

## 2022-11-18 DIAGNOSIS — Z0001 Encounter for general adult medical examination with abnormal findings: Secondary | ICD-10-CM | POA: Diagnosis not present

## 2022-11-18 DIAGNOSIS — I10 Essential (primary) hypertension: Secondary | ICD-10-CM

## 2022-11-18 DIAGNOSIS — F411 Generalized anxiety disorder: Secondary | ICD-10-CM

## 2022-11-18 DIAGNOSIS — Z1211 Encounter for screening for malignant neoplasm of colon: Secondary | ICD-10-CM

## 2022-11-18 LAB — CBC WITH DIFFERENTIAL/PLATELET
Basophils Absolute: 0.1 10*3/uL (ref 0.0–0.2)
Basos: 1 %
EOS (ABSOLUTE): 0.1 10*3/uL (ref 0.0–0.4)
Eos: 2 %
Hematocrit: 46.5 % (ref 37.5–51.0)
Hemoglobin: 14.8 g/dL (ref 13.0–17.7)
Immature Grans (Abs): 0 10*3/uL (ref 0.0–0.1)
Immature Granulocytes: 0 %
Lymphocytes Absolute: 1.5 10*3/uL (ref 0.7–3.1)
Lymphs: 25 %
MCH: 27.6 pg (ref 26.6–33.0)
MCHC: 31.8 g/dL (ref 31.5–35.7)
MCV: 87 fL (ref 79–97)
Monocytes Absolute: 0.5 10*3/uL (ref 0.1–0.9)
Monocytes: 8 %
Neutrophils Absolute: 3.8 10*3/uL (ref 1.4–7.0)
Neutrophils: 64 %
Platelets: 169 10*3/uL (ref 150–450)
RBC: 5.37 x10E6/uL (ref 4.14–5.80)
RDW: 14.9 % (ref 11.6–15.4)
WBC: 6 10*3/uL (ref 3.4–10.8)

## 2022-11-18 LAB — CMP14+EGFR
ALT: 34 IU/L (ref 0–44)
AST: 24 IU/L (ref 0–40)
Albumin: 4.5 g/dL (ref 3.8–4.9)
Alkaline Phosphatase: 67 IU/L (ref 44–121)
BUN/Creatinine Ratio: 12 (ref 10–24)
BUN: 12 mg/dL (ref 8–27)
Bilirubin Total: 0.3 mg/dL (ref 0.0–1.2)
CO2: 22 mmol/L (ref 20–29)
Calcium: 9.2 mg/dL (ref 8.6–10.2)
Chloride: 104 mmol/L (ref 96–106)
Creatinine, Ser: 0.99 mg/dL (ref 0.76–1.27)
Globulin, Total: 2.4 g/dL (ref 1.5–4.5)
Glucose: 118 mg/dL — ABNORMAL HIGH (ref 70–99)
Potassium: 4.4 mmol/L (ref 3.5–5.2)
Sodium: 140 mmol/L (ref 134–144)
Total Protein: 6.9 g/dL (ref 6.0–8.5)
eGFR: 87 mL/min/{1.73_m2} (ref >59)

## 2022-11-18 LAB — VITAMIN D 25 HYDROXY (VIT D DEFICIENCY, FRACTURES)

## 2022-11-18 LAB — LIPID PANEL
Chol/HDL Ratio: 2.7 ratio (ref 0.0–5.0)
Cholesterol, Total: 102 mg/dL (ref 100–199)
HDL: 38 mg/dL — ABNORMAL LOW (ref >39)
LDL Chol Calc (NIH): 44 mg/dL (ref 0–99)
Triglycerides: 110 mg/dL (ref 0–149)
VLDL Cholesterol Cal: 20 mg/dL (ref 5–40)

## 2022-11-18 LAB — PSA

## 2022-11-18 LAB — BAYER DCA HB A1C WAIVED: HB A1C (BAYER DCA - WAIVED): 5.9 % — ABNORMAL HIGH (ref 4.8–5.6)

## 2022-11-18 LAB — URIC ACID

## 2022-11-18 LAB — TESTOSTERONE

## 2022-11-18 MED ORDER — ROSUVASTATIN CALCIUM 40 MG PO TABS
40.0000 mg | ORAL_TABLET | Freq: Every day | ORAL | 3 refills | Status: DC
Start: 2022-11-18 — End: 2023-06-06

## 2022-11-18 MED ORDER — CELECOXIB 200 MG PO CAPS
200.0000 mg | ORAL_CAPSULE | Freq: Every day | ORAL | 0 refills | Status: DC | PRN
Start: 2022-11-18 — End: 2024-01-02

## 2022-11-18 MED ORDER — SILDENAFIL CITRATE 100 MG PO TABS
50.0000 mg | ORAL_TABLET | Freq: Every day | ORAL | 11 refills | Status: DC | PRN
Start: 2022-11-18 — End: 2024-01-02

## 2022-11-18 MED ORDER — ATENOLOL 25 MG PO TABS
25.0000 mg | ORAL_TABLET | Freq: Every day | ORAL | 3 refills | Status: DC
Start: 2022-11-18 — End: 2023-06-06

## 2022-11-18 MED ORDER — TESTOSTERONE CYPIONATE 200 MG/ML IM SOLN
50.0000 mg | INTRAMUSCULAR | 1 refills | Status: DC
Start: 2022-11-18 — End: 2023-06-06

## 2022-11-18 MED ORDER — ESCITALOPRAM OXALATE 10 MG PO TABS
ORAL_TABLET | ORAL | 3 refills | Status: DC
Start: 2022-11-18 — End: 2023-06-06

## 2022-11-18 MED ORDER — COLCHICINE 0.6 MG PO TABS
ORAL_TABLET | ORAL | 0 refills | Status: DC
Start: 2022-11-18 — End: 2024-01-02

## 2022-11-18 NOTE — Progress Notes (Signed)
Jorge [name] is a 62 y.o. male presents to office today for annual physical exam examination.    Concerns today include: 1.  Neck spasm Reports a couple of weeks ago he had a left-sided neck stiffness where he had several palpable knots.  He had difficulty rotating the neck without pain.  He took a couple of Celebrex and that seem to alleviate.  He denied any preceding change in activity, sleeping wrong etc.  It is not recurred.  He denies any upper extremity numbness tingling or weakness.  2.  Testosterone deficiency Patient was encouraged by his urologist to take 0.25 mL of the testosterone every 7 days to achieve a better steady state rather than 1 mL of 200 mg every 28 days.  He has been doing this and had to have no difficulty.  He denies any breast development, scrotal changes etc.  Marital status: married, Substance use: none Health Maintenance Due  Topic Date Due   Zoster Vaccines- Shingrix (1 of 2) Never done   Colonoscopy  01/28/2021   COVID-19 Vaccine (4 - 2023-24 season) 12/17/2021   INFLUENZA VACCINE  11/17/2022   Refills needed today: all  Immunization History  Administered Date(s) Administered   Influenza Split 03/24/2009, 04/06/2011   Influenza, Seasonal, Injecte, Preservative Fre 12/29/2009, 03/28/2012   Influenza,inj,Quad PF,6+ Mos 03/29/2013, 02/13/2014, 02/24/2015, 03/02/2016, 02/14/2017, 03/02/2018, 01/18/2019, 02/04/2020, 01/26/2021, 02/16/2022   PFIZER(Purple Top)SARS-COV-2 Vaccination 07/05/2019, 07/30/2019, 03/11/2020   Td 11/17/2010   Tdap 11/17/2010, 11/03/2021   Past Medical History:  Diagnosis Date   Abnormal liver enzymes    Duodenitis    Gout    Hemorrhoids    Hiatal hernia    Hyperlipidemia    Hypertension    Kidney stones    Testosterone deficiency    Tubular adenoma of colon    Social History   Socioeconomic History   Marital status: Married    Spouse name: Not on file   Number of children: 1   Years of education: Not on file    Highest education level: Not on file  Occupational History   Occupation: Management  Tobacco Use   Smoking status: Never   Smokeless tobacco: Never  Substance and Sexual Activity   Alcohol use: No   Drug use: No   Sexual activity: Not on file  Other Topics Concern   Not on file  Social History Narrative   Not on file   Social Determinants of Health   Financial Resource Strain: Not on file  Food Insecurity: Not on file  Transportation Needs: Not on file  Physical Activity: Not on file  Stress: Not on file  Social Connections: Not on file  Intimate Partner Violence: Not on file   Past Surgical History:  Procedure Laterality Date   LITHOTRIPSY     VASECTOMY     Family History  Problem Relation Age of Onset   Heart disease Mother 46       Heart Valve Disease   Hypertension Mother    Diabetes Father    Colon cancer Father     Current Outpatient Medications:    atenolol (TENORMIN) 25 MG tablet, TAKE ONE (1) TABLET BY MOUTH EVERY DAY, Disp: 90 tablet, Rfl: 0   celecoxib (CELEBREX) 200 MG capsule, Take 1 capsule (200 mg total) by mouth daily as needed (gout flare/ pain)., Disp: 30 capsule, Rfl: 0   cholecalciferol (VITAMIN D) 1000 UNITS tablet, Take 2,000 Units by mouth daily., Disp: , Rfl:    colchicine 0.6 MG  tablet, Take 2 tablets at onset of gout flare.  Then take 1 tablet daily until gout flare resolved., Disp: 30 tablet, Rfl: 0   escitalopram (LEXAPRO) 10 MG tablet, TAKE ONE (1) TABLET BY MOUTH EVERY DAY, Disp: 90 tablet, Rfl: 3   Multiple Vitamins-Minerals (MULTIVITAMIN ADULT PO), Take by mouth., Disp: , Rfl:    omeprazole (PRILOSEC OTC) 20 MG tablet, Take 1 tablet (20 mg total) by mouth daily., Disp: 90 tablet, Rfl: 1   rosuvastatin (CRESTOR) 40 MG tablet, Take 1 tablet (40 mg total) by mouth daily. TAKE ONE (1) TABLET EACH DAY, Disp: 90 tablet, Rfl: 3   sildenafil (VIAGRA) 100 MG tablet, TAKE 1/2 TO 1 TABLET DAILY AS NEEDED, Disp: 5 tablet, Rfl: 11   testosterone  cypionate (DEPOTESTOSTERONE CYPIONATE) 200 MG/ML injection, INJECT IM EVERY 28 DAYS, Disp: 10 mL, Rfl: 1   Turmeric (QC TUMERIC COMPLEX PO), Take by mouth., Disp: , Rfl:    vitamin C (ASCORBIC ACID) 500 MG tablet, Take 500 mg by mouth daily., Disp: , Rfl:   Allergies  Allergen Reactions   Penicillins Rash     ROS: Review of Systems Pertinent items noted in HPI and remainder of comprehensive ROS otherwise negative.    Physical exam BP (!) 154/78   Pulse (!) 51   Temp 99.4 F (37.4 C) (Oral)   Ht 5\' 5"  (1.651 m)   Wt 158 lb (71.7 kg)   SpO2 98%   BMI 26.29 kg/m  General appearance: alert, cooperative, appears stated age, and no distress Head: Normocephalic, without obvious abnormality, atraumatic Eyes: negative findings: lids and lashes normal and conjunctivae and sclerae normal Ears: normal TM's and external ear canals both ears Nose: Nares normal. Septum midline. Mucosa normal. No drainage or sinus tenderness. Throat: lips, mucosa, and tongue normal; teeth and gums normal Neck: no adenopathy, no carotid bruit, supple, symmetrical, trachea midline, and thyroid not enlarged, symmetric, no tenderness/mass/nodules Back: symmetric, no curvature. ROM normal. No CVA tenderness. Lungs: clear to auscultation bilaterally Chest wall: no tenderness Heart: regular rate and rhythm, S1, S2 normal, no murmur, click, rub or gallop Abdomen: soft, non-tender; bowel sounds normal; no masses,  no organomegaly Extremities: extremities normal, atraumatic, no cyanosis or edema Pulses: 2+ and symmetric Skin: Skin color, texture, turgor normal. No rashes or lesions Lymph nodes: Cervical, supraclavicular, and axillary nodes normal. Neurologic: Grossly normal       11/18/2022    8:00 AM 12/24/2021    3:13 PM 11/29/2021    1:55 PM  Depression screen PHQ 2/9  Decreased Interest 0 0 0  Down, Depressed, Hopeless 0 0 0  PHQ - 2 Score 0 0 0  Altered sleeping 0 0   Tired, decreased energy 0 0    Change in appetite 0 0   Feeling bad or failure about yourself  0 0   Trouble concentrating 0 0   Moving slowly or fidgety/restless 0 0   Suicidal thoughts 0 0   PHQ-9 Score 0 0   Difficult doing work/chores Not difficult at all Not difficult at all       11/18/2022    8:00 AM 12/24/2021    3:13 PM 11/29/2021    1:55 PM 12/11/2020    4:14 PM  GAD 7 : Generalized Anxiety Score  Nervous, Anxious, on Edge 0 0 0 0  Control/stop worrying 0 0 0 0  Worry too much - different things 0 0 0 0  Trouble relaxing 0 0 0 0  Restless  0 0 0 0  Easily annoyed or irritable 0 0 0 0  Afraid - awful might happen 0 0 0 0  Total GAD 7 Score 0 0 0 0  Anxiety Difficulty Not difficult at all Not difficult at all Not difficult at all Not difficult at all     Assessment/ Plan: Gean Quint here for annual physical exam.   Annual physical exam  Colon cancer screening - Plan: Ambulatory referral to Gastroenterology  Testosterone deficiency - Plan: CBC with Differential, Testosterone, PSA, ToxASSURE Select 13 (MW), Urine, CBC with Differential, Testosterone, PSA, testosterone cypionate (DEPOTESTOSTERONE CYPIONATE) 200 MG/ML injection  Controlled substance agreement signed - Plan: ToxASSURE Select 13 (MW), Urine  Erectile dysfunction due to arterial insufficiency - Plan: sildenafil (VIAGRA) 100 MG tablet  BMI 26.0-26.9,adult - Plan: Lipid Panel, Lipid Panel  White coat syndrome with diagnosis of hypertension - Plan: CMP14+EGFR, atenolol (TENORMIN) 25 MG tablet, CMP14+EGFR  Pure hypercholesterolemia - Plan: rosuvastatin (CRESTOR) 40 MG tablet  Pre-diabetes - Plan: CMP14+EGFR, Bayer DCA Hb A1c Waived, CMP14+EGFR, Bayer DCA Hb A1c Waived  Vitamin D deficiency - Plan: VITAMIN D 25 Hydroxy (Vit-D Deficiency, Fractures), VITAMIN D 25 Hydroxy (Vit-D Deficiency, Fractures)  Idiopathic gout of right ankle, unspecified chronicity - Plan: Uric acid, celecoxib (CELEBREX) 200 MG capsule, colchicine 0.6 MG tablet,  Uric acid  Generalized anxiety disorder - Plan: escitalopram (LEXAPRO) 10 MG tablet  Referred for colonoscopy.  UDS and CSC were updated as per office policy for testosterone.  I have renewed his medications.  Check PSA, CBC, testosterone level.  Of note he has been out of medication for 2 weeks.  National narcotic database reviewed and there were no red flags.  Continue Viagra as needed for ED  Fasting lipid panel collected today.  Continue statin.  Check liver enzymes.  Blood pressure not at goal but patient has not taken his blood pressure medications  Check A1c given history of prediabetes and vitamin D given history of deficiency  Asymptomatic from a gout standpoint.  Check uric acid.  Medications have been renewed  Anxiety stable.  Continue Lexapro  Shingles vaccine administered today  Counseled on healthy lifestyle choices, including diet (rich in fruits, vegetables and lean meats and low in salt and simple carbohydrates) and exercise (at least 30 minutes of moderate physical activity daily).  Patient to follow up 15m for testosterone  Janith Nielson M. Nadine Counts, DO

## 2022-11-23 LAB — TOXASSURE SELECT 13 (MW), URINE

## 2022-11-24 ENCOUNTER — Encounter: Payer: Self-pay | Admitting: Internal Medicine

## 2023-01-26 ENCOUNTER — Ambulatory Visit: Payer: 59

## 2023-01-26 ENCOUNTER — Telehealth: Payer: Self-pay

## 2023-01-26 VITALS — Ht 65.0 in | Wt 155.0 lb

## 2023-01-26 DIAGNOSIS — Z860100 Personal history of colon polyps, unspecified: Secondary | ICD-10-CM

## 2023-01-26 DIAGNOSIS — Z800 Family history of malignant neoplasm of digestive organs: Secondary | ICD-10-CM

## 2023-01-26 MED ORDER — NA SULFATE-K SULFATE-MG SULF 17.5-3.13-1.6 GM/177ML PO SOLN
1.0000 | Freq: Once | ORAL | 0 refills | Status: AC
Start: 2023-01-26 — End: 2023-01-26

## 2023-01-26 NOTE — Progress Notes (Signed)

## 2023-01-26 NOTE — Telephone Encounter (Signed)
PV in progress  

## 2023-02-09 ENCOUNTER — Encounter: Payer: Self-pay | Admitting: Internal Medicine

## 2023-02-23 ENCOUNTER — Ambulatory Visit: Payer: 59 | Admitting: Internal Medicine

## 2023-02-23 ENCOUNTER — Encounter: Payer: Self-pay | Admitting: Internal Medicine

## 2023-02-23 VITALS — BP 98/62 | HR 58 | Temp 98.0°F | Resp 18 | Ht 65.0 in | Wt 155.0 lb

## 2023-02-23 DIAGNOSIS — Z860100 Personal history of colon polyps, unspecified: Secondary | ICD-10-CM

## 2023-02-23 DIAGNOSIS — Z800 Family history of malignant neoplasm of digestive organs: Secondary | ICD-10-CM

## 2023-02-23 DIAGNOSIS — D123 Benign neoplasm of transverse colon: Secondary | ICD-10-CM

## 2023-02-23 DIAGNOSIS — Z09 Encounter for follow-up examination after completed treatment for conditions other than malignant neoplasm: Secondary | ICD-10-CM | POA: Diagnosis present

## 2023-02-23 DIAGNOSIS — Z860101 Personal history of adenomatous and serrated colon polyps: Secondary | ICD-10-CM | POA: Diagnosis not present

## 2023-02-23 MED ORDER — SODIUM CHLORIDE 0.9 % IV SOLN: 500.0000 mL | Freq: Once | INTRAVENOUS | Status: DC

## 2023-02-23 NOTE — Progress Notes (Signed)
GASTROENTEROLOGY PROCEDURE H&P NOTE   Primary Care Physician: Raliegh Ip, DO    Reason for Procedure:  Personal history of adenomatous colon polyp and family history of colon cancer  Plan:    Colonoscopy  Patient is appropriate for endoscopic procedure(s) in the ambulatory ([facility]) setting.  The nature of the procedure, as well as the risks, benefits, and alternatives were carefully and thoroughly reviewed with the patient. Ample time for discussion and questions allowed. The patient understood, was satisfied, and agreed to proceed.     HPI: Jorge [name] is a 62 y.o. male who presents for colonoscopy.  Medical history as below.  Tolerated the prep.  No recent chest pain or shortness of breath.  No abdominal pain today.  Past Medical History:  Diagnosis Date   Abnormal liver enzymes    Duodenitis    Gout    Hemorrhoids    Hiatal hernia    Hyperlipidemia    Hypertension    Kidney stones    Testosterone deficiency    Tubular adenoma of colon     Past Surgical History:  Procedure Laterality Date   COLONOSCOPY  01/28/2006   Dr. Rhea Belton   LITHOTRIPSY     VASECTOMY      Prior to Admission medications   Medication Sig Start Date End Date Taking? Authorizing Provider  atenolol (TENORMIN) 25 MG tablet Take 1 tablet (25 mg total) by mouth daily. 11/18/22  Yes Delynn Flavin M, DO  celecoxib (CELEBREX) 200 MG capsule Take 1 capsule (200 mg total) by mouth daily as needed (gout flare/ pain). 11/18/22  Yes Delynn Flavin M, DO  cholecalciferol (VITAMIN D) 1000 UNITS tablet Take 2,000 Units by mouth daily.   Yes [provider]  escitalopram (LEXAPRO) 10 MG tablet TAKE ONE (1) TABLET BY MOUTH EVERY DAY 11/18/22  Yes Delynn Flavin M, DO  Multiple Vitamins-Minerals (MULTIVITAMIN ADULT PO) Take by mouth.   Yes [provider]  omeprazole (PRILOSEC OTC) 20 MG tablet Take 1 tablet (20 mg total) by mouth daily. 01/18/19  Yes Martin, [name], FNP   rosuvastatin (CRESTOR) 40 MG tablet Take 1 tablet (40 mg total) by mouth daily. TAKE ONE (1) TABLET EACH DAY 11/18/22  Yes Gottschalk, Ashly M, DO  Turmeric (QC TUMERIC COMPLEX PO) Take by mouth.   Yes [provider]  vitamin C (ASCORBIC ACID) 500 MG tablet Take 500 mg by mouth daily.   Yes [provider]  colchicine 0.6 MG tablet Take 2 tablets at onset of gout flare.  Then take 1 tablet daily until gout flare resolved. 11/18/22   Raliegh Ip, DO  sildenafil (VIAGRA) 100 MG tablet Take 0.5-1 tablets (50-100 mg total) by mouth daily as needed for erectile dysfunction. 11/18/22   Raliegh Ip, DO  testosterone cypionate (DEPOTESTOSTERONE CYPIONATE) 200 MG/ML injection Inject 0.25 mLs (50 mg total) into the muscle every 7 (seven) days. 11/18/22   Raliegh Ip, DO    Current Outpatient Medications  Medication Sig Dispense Refill   atenolol (TENORMIN) 25 MG tablet Take 1 tablet (25 mg total) by mouth daily. 100 tablet 3   celecoxib (CELEBREX) 200 MG capsule Take 1 capsule (200 mg total) by mouth daily as needed (gout flare/ pain). 30 capsule 0   cholecalciferol (VITAMIN D) 1000 UNITS tablet Take 2,000 Units by mouth daily.     escitalopram (LEXAPRO) 10 MG tablet TAKE ONE (1) TABLET BY MOUTH EVERY DAY 100 tablet 3   Multiple Vitamins-Minerals (MULTIVITAMIN ADULT PO)  Take by mouth.     omeprazole (PRILOSEC OTC) 20 MG tablet Take 1 tablet (20 mg total) by mouth daily. 90 tablet 1   rosuvastatin (CRESTOR) 40 MG tablet Take 1 tablet (40 mg total) by mouth daily. TAKE ONE (1) TABLET EACH DAY 100 tablet 3   Turmeric (QC TUMERIC COMPLEX PO) Take by mouth.     vitamin C (ASCORBIC ACID) 500 MG tablet Take 500 mg by mouth daily.     colchicine 0.6 MG tablet Take 2 tablets at onset of gout flare.  Then take 1 tablet daily until gout flare resolved. 30 tablet 0   sildenafil (VIAGRA) 100 MG tablet Take 0.5-1 tablets (50-100 mg total) by mouth daily as needed for erectile  dysfunction. 5 tablet 11   testosterone cypionate (DEPOTESTOSTERONE CYPIONATE) 200 MG/ML injection Inject 0.25 mLs (50 mg total) into the muscle every 7 (seven) days. 10 mL 1   Current Facility-Administered Medications  Medication Dose Route Frequency Provider Last Rate Last Admin   0.9 %  sodium chloride infusion  500 mL Intravenous Once Quanna Wittke, Carie Caddy, MD        Allergies as of 02/23/2023 - Review Complete 02/23/2023  Allergen Reaction Noted   Penicillins Rash 03/31/2011    Family History  Problem Relation Age of Onset   Heart disease Mother 52       Heart Valve Disease   Hypertension Mother    Diabetes Father    Colon cancer Father    Stomach cancer Neg Hx    Rectal cancer Neg Hx    Esophageal cancer Neg Hx     Social History   Socioeconomic History   Marital status: Married    Spouse name: Not on file   Number of children: 1   Years of education: Not on file   Highest education level: Not on file  Occupational History   Occupation: Management  Tobacco Use   Smoking status: Never   Smokeless tobacco: Never  Vaping Use   Vaping status: Never Used  Substance and Sexual Activity   Alcohol use: No   Drug use: No   Sexual activity: Not on file  Other Topics Concern   Not on file  Social History Narrative   Not on file   Social Determinants of Health   Financial Resource Strain: Not on file  Food Insecurity: Not on file  Transportation Needs: Not on file  Physical Activity: Not on file  Stress: Not on file  Social Connections: Not on file  Intimate Partner Violence: Not on file    Physical Exam: Vital signs in last 24 hours: @BP  127/70   Pulse 66   Temp 98 F (36.7 C)   Ht 5\' 5"  (1.651 m)   Wt 155 lb (70.3 kg)   SpO2 98%   BMI 25.79 kg/m  GEN: NAD EYE: Sclerae anicteric ENT: MMM CV: Non-tachycardic Pulm: CTA b/l GI: Soft, NT/ND NEURO:  Alert & Oriented x 3   Erick Blinks, MD [facility]  02/23/2023 8:00 AM

## 2023-02-23 NOTE — Progress Notes (Signed)
Called to room to assist during endoscopic procedure.  Patient ID and intended procedure confirmed with present staff. Received instructions for my participation in the procedure from the performing physician.  

## 2023-02-23 NOTE — Op Note (Signed)
[facility] Patient Name: Jorge [name] Procedure Date: 02/23/2023 8:03 AM MRN: [id number] Endoscopist: Beverley Fiedler , MD, [id number] Age: 62 Referring MD:  Date of Birth: 04-04-1961 Gender: Male Account #: [id number] Procedure:                Colonoscopy Indications:              High risk colon cancer surveillance: Personal                            history of non-advanced adenoma, Family history of                            colon cancer in a first-degree relative, Last                            colonoscopy: October 2017 Medicines:                Monitored Anesthesia Care Procedure:                Pre-Anesthesia Assessment:                           - Prior to the procedure, a History and Physical                            was performed, and patient medications and                            allergies were reviewed. The patient's tolerance of                            previous anesthesia was also reviewed. The risks                            and benefits of the procedure and the sedation                            options and risks were discussed with the patient.                            All questions were answered, and informed consent                            was obtained. Prior Anticoagulants: The patient has                            taken no anticoagulant or antiplatelet agents. ASA                            Grade Assessment: II - A patient with mild systemic                            disease. After reviewing the risks and benefits,  the patient was deemed in satisfactory condition to                            undergo the procedure.                           After obtaining informed consent, the colonoscope                            was passed under direct vision. Throughout the                            procedure, the patient's blood pressure, pulse, and                            oxygen saturations were monitored  continuously. The                            Olympus Scope [id number] was introduced through the                            anus and advanced to the terminal ileum. The                            colonoscopy was performed without difficulty. The                            patient tolerated the procedure well. The quality                            of the bowel preparation was excellent. The                            ileocecal valve, appendiceal orifice, and rectum                            were photographed. Scope In: 8:08:24 AM Scope Out: 8:16:55 AM Scope Withdrawal Time: 0 hours 7 minutes 8 seconds  Total Procedure Duration: 0 hours 8 minutes 31 seconds  Findings:                 The digital rectal exam was normal.                           The terminal ileum appeared normal.                           A 5 mm polyp was found in the transverse colon. The                            polyp was sessile. The polyp was removed with a                            cold snare. Resection and retrieval were complete.  A few small-mouthed diverticula were found in the                            sigmoid colon.                           Internal hemorrhoids were found during                            retroflexion. The hemorrhoids were small. Complications:            No immediate complications. Estimated Blood Loss:     Estimated blood loss: none. Impression:               - The examined portion of the ileum was normal.                           - One 5 mm polyp in the transverse colon, removed                            with a cold snare. Resected and retrieved.                           - Mild diverticulosis in the sigmoid colon.                           - Small internal hemorrhoids. Recommendation:           - Patient has a contact number available for                            emergencies. The signs and symptoms of potential                            delayed  complications were discussed with the                            patient. Return to normal activities tomorrow.                            Written discharge instructions were provided to the                            patient.                           - Resume previous diet.                           - Continue present medications.                           - Await pathology results.                           - Repeat colonoscopy in 5 years for surveillance. Beverley Fiedler, MD 02/23/2023 8:29:36 AM This report has been  signed electronically.

## 2023-02-23 NOTE — Patient Instructions (Addendum)
Resume all of your previous medications today as ordered.  Read the handouts given to you by your recovery room nurse.  You will need another colonoscopy in 5 years.  YOU HAD AN ENDOSCOPIC PROCEDURE TODAY AT THE [facility]:   Refer to the procedure report that was given to you for any specific questions about what was found during the examination.  If the procedure report does not answer your questions, please call your gastroenterologist to clarify.  If you requested that your care partner not be given the details of your procedure findings, then the procedure report has been included in a sealed envelope for you to review at your convenience later.  YOU SHOULD EXPECT: Some feelings of bloating in the abdomen. Passage of more gas than usual.  Walking can help get rid of the air that was put into your GI tract during the procedure and reduce the bloating. If you had a lower endoscopy (such as a colonoscopy or flexible sigmoidoscopy) you may notice spotting of blood in your stool or on the toilet paper. If you underwent a bowel prep for your procedure, you may not have a normal bowel movement for a few days.  Please Note:  You might notice some irritation and congestion in your nose or some drainage.  This is from the oxygen used during your procedure.  There is no need for concern and it should clear up in a day or so.  SYMPTOMS TO REPORT IMMEDIATELY:  Following lower endoscopy (colonoscopy or flexible sigmoidoscopy):  Excessive amounts of blood in the stool  Significant tenderness or worsening of abdominal pains  Swelling of the abdomen that is new, acute  Fever of 100F or higher   For urgent or emergent issues, a gastroenterologist can be reached at any hour by calling [phone]. Do not use MyChart messaging for urgent concerns.    DIET:  We do recommend a small meal at first, but then you may proceed to your regular diet.  Drink plenty of fluids but you should avoid  alcoholic beverages for 24 hours. Try to increase the fiber in your diet,and drink plenty of water.  ACTIVITY:  You should plan to take it easy for the rest of today and you should NOT DRIVE or use heavy machinery until tomorrow (because of the sedation medicines used during the test).    FOLLOW UP: Our staff will call the number listed on your records the next business day following your procedure.  We will call around 7:15- 8:00 am to check on you and address any questions or concerns that you may have regarding the information given to you following your procedure. If we do not reach you, we will leave a message.     If any biopsies were taken you will be contacted by phone or by letter within the next 1-3 weeks.  Please call us at [phone] if you have not heard about the biopsies in 3 weeks.    SIGNATURES/CONFIDENTIALITY: You and/or your care partner have signed paperwork which will be entered into your electronic medical record.  These signatures attest to the fact that that the information above on your After Visit Summary has been reviewed and is understood.  Full responsibility of the confidentiality of this discharge information lies with you and/or your care-partner.

## 2023-02-23 NOTE — Progress Notes (Signed)
Pt's states no medical or surgical changes since previsit or office visit. 

## 2023-02-23 NOTE — Progress Notes (Signed)
Report to PACU, RN, vss, BBS= Clear.  

## 2023-02-24 ENCOUNTER — Telehealth: Payer: Self-pay

## 2023-02-24 NOTE — Telephone Encounter (Signed)
  Follow up Call-     02/23/2023    7:20 AM  Call back number  Post procedure Call Back phone  # [phone]  Permission to leave phone message Yes     Patient questions:  Do you have a fever, pain , or abdominal swelling? No. Pain Score  0 *  Have you tolerated food without any problems? Yes.    Have you been able to return to your normal activities? Yes.    Do you have any questions about your discharge instructions: Diet   No. Medications  No. Follow up visit  No.  Do you have questions or concerns about your Care? No.  Actions: * If pain score is 4 or above: No action needed, pain <4.

## 2023-02-27 ENCOUNTER — Encounter: Payer: Self-pay | Admitting: Internal Medicine

## 2023-02-27 LAB — SURGICAL PATHOLOGY

## 2023-05-16 ENCOUNTER — Telehealth: Payer: Self-pay | Admitting: Family Medicine

## 2023-06-06 ENCOUNTER — Encounter: Payer: Self-pay | Admitting: Family Medicine

## 2023-06-06 ENCOUNTER — Other Ambulatory Visit: Payer: Self-pay | Admitting: Family Medicine

## 2023-06-06 ENCOUNTER — Other Ambulatory Visit: Payer: 59

## 2023-06-06 ENCOUNTER — Ambulatory Visit (INDEPENDENT_AMBULATORY_CARE_PROVIDER_SITE_OTHER): Payer: 59 | Admitting: Family Medicine

## 2023-06-06 VITALS — BP 142/82 | HR 62 | Temp 98.4°F | Ht 65.0 in | Wt 168.0 lb

## 2023-06-06 DIAGNOSIS — E349 Endocrine disorder, unspecified: Secondary | ICD-10-CM

## 2023-06-06 DIAGNOSIS — R7303 Prediabetes: Secondary | ICD-10-CM

## 2023-06-06 DIAGNOSIS — Z23 Encounter for immunization: Secondary | ICD-10-CM

## 2023-06-06 DIAGNOSIS — I10 Essential (primary) hypertension: Secondary | ICD-10-CM

## 2023-06-06 DIAGNOSIS — E7800 Pure hypercholesterolemia, unspecified: Secondary | ICD-10-CM | POA: Diagnosis not present

## 2023-06-06 DIAGNOSIS — E559 Vitamin D deficiency, unspecified: Secondary | ICD-10-CM | POA: Diagnosis not present

## 2023-06-06 DIAGNOSIS — K219 Gastro-esophageal reflux disease without esophagitis: Secondary | ICD-10-CM

## 2023-06-06 DIAGNOSIS — F411 Generalized anxiety disorder: Secondary | ICD-10-CM

## 2023-06-06 MED ORDER — ATENOLOL 25 MG PO TABS: 25.0000 mg | ORAL_TABLET | Freq: Every day | ORAL | 3 refills | Status: DC

## 2023-06-06 MED ORDER — OMEPRAZOLE MAGNESIUM 20 MG PO TBEC: 20.0000 mg | DELAYED_RELEASE_TABLET | Freq: Every day | ORAL | 1 refills | Status: DC

## 2023-06-06 MED ORDER — TESTOSTERONE CYPIONATE 200 MG/ML IM SOLN: 50.0000 mg | INTRAMUSCULAR | 1 refills | Status: DC

## 2023-06-06 MED ORDER — ESCITALOPRAM OXALATE 10 MG PO TABS: ORAL_TABLET | ORAL | 3 refills | Status: DC

## 2023-06-06 MED ORDER — ROSUVASTATIN CALCIUM 40 MG PO TABS: 40.0000 mg | ORAL_TABLET | Freq: Every day | ORAL | 3 refills | Status: DC

## 2023-06-06 NOTE — Progress Notes (Signed)
Subjective: CC: Testosterone monitoring PCP: Raliegh Ip, DO [id number] Jorge [name] is a 63 y.o. male presenting to clinic today for:  1.  Hypogonadism Patient is compliant with weekly testosterone injection.  Here for interval checkup.  Reports no gynecomastia or testicular shrinkage.  He uses Viagra as needed ED.  He does report that his drug screen that was done by a serum collection last time was actually not covered by his insurance.  This surprised him.  2.  Hypertension associated with hyperlipidemia Compliant with medications.  No reports of chest pain, shortness of breath or changes in exercise tolerance   ROS: Per HPI  Allergies  Allergen Reactions   Penicillins Rash   Past Medical History:  Diagnosis Date   Abnormal liver enzymes    Duodenitis    Gout    Hemorrhoids    Hiatal hernia    Hyperlipidemia    Hypertension    Kidney stones    Testosterone deficiency    Tubular adenoma of colon     Current Outpatient Medications:    atenolol (TENORMIN) 25 MG tablet, Take 1 tablet (25 mg total) by mouth daily., Disp: 100 tablet, Rfl: 3   celecoxib (CELEBREX) 200 MG capsule, Take 1 capsule (200 mg total) by mouth daily as needed (gout flare/ pain)., Disp: 30 capsule, Rfl: 0   cholecalciferol (VITAMIN D) 1000 UNITS tablet, Take 2,000 Units by mouth daily., Disp: , Rfl:    colchicine 0.6 MG tablet, Take 2 tablets at onset of gout flare.  Then take 1 tablet daily until gout flare resolved., Disp: 30 tablet, Rfl: 0   escitalopram (LEXAPRO) 10 MG tablet, TAKE ONE (1) TABLET BY MOUTH EVERY DAY, Disp: 100 tablet, Rfl: 3   Multiple Vitamins-Minerals (MULTIVITAMIN ADULT PO), Take by mouth., Disp: , Rfl:    omeprazole (PRILOSEC OTC) 20 MG tablet, Take 1 tablet (20 mg total) by mouth daily., Disp: 90 tablet, Rfl: 1   rosuvastatin (CRESTOR) 40 MG tablet, Take 1 tablet (40 mg total) by mouth daily. TAKE ONE (1) TABLET EACH DAY, Disp: 100 tablet, Rfl: 3   sildenafil (VIAGRA) 100 MG  tablet, Take 0.5-1 tablets (50-100 mg total) by mouth daily as needed for erectile dysfunction., Disp: 5 tablet, Rfl: 11   testosterone cypionate (DEPOTESTOSTERONE CYPIONATE) 200 MG/ML injection, Inject 0.25 mLs (50 mg total) into the muscle every 7 (seven) days., Disp: 10 mL, Rfl: 1   Turmeric (QC TUMERIC COMPLEX PO), Take by mouth., Disp: , Rfl:    vitamin C (ASCORBIC ACID) 500 MG tablet, Take 500 mg by mouth daily., Disp: , Rfl:  Social History   Socioeconomic History   Marital status: Married    Spouse name: Not on file   Number of children: 1   Years of education: Not on file   Highest education level: Associate degree: occupational, Scientist, product/process development, or vocational program  Occupational History   Occupation: Management  Tobacco Use   Smoking status: Never   Smokeless tobacco: Never  Vaping Use   Vaping status: Never Used  Substance and Sexual Activity   Alcohol use: No   Drug use: No   Sexual activity: Not on file  Other Topics Concern   Not on file  Social History Narrative   Not on file   Social Drivers of Health   Financial Resource Strain: Low Risk  (06/05/2023)   Overall Financial Resource Strain (CARDIA)    Difficulty of Paying Living Expenses: Not very hard  Food Insecurity: No Food Insecurity (06/05/2023)  Hunger Vital Sign    Worried About Running Out of Food in the Last Year: Never true    Ran Out of Food in the Last Year: Never true  Transportation Needs: No Transportation Needs (06/05/2023)   PRAPARE - Administrator, Civil Service (Medical): No    Lack of Transportation (Non-Medical): No  Physical Activity: Sufficiently Active (06/05/2023)   Exercise Vital Sign    Days of Exercise per Week: 5 days    Minutes of Exercise per Session: 120 min  Stress: Stress Concern Present (06/05/2023)   [employer] of Occupational Health - Occupational Stress Questionnaire    Feeling of Stress : To some extent  Social Connections: Unknown (06/05/2023)   Social  Connection and Isolation Panel [NHANES]    Frequency of Communication with Friends and Family: More than three times a week    Frequency of Social Gatherings with Friends and Family: Twice a week    Attends Religious Services: Patient declined    Database administrator or Organizations: No    Attends Engineer, structural: Not on file    Marital Status: Married  Catering manager Violence: Not on file   Family History  Problem Relation Age of Onset   Heart disease Mother 22       Heart Valve Disease   Hypertension Mother    Diabetes Father    Colon cancer Father    Stomach cancer Neg Hx    Rectal cancer Neg Hx    Esophageal cancer Neg Hx     Objective: Office vital signs reviewed. BP (!) 142/82   Pulse 62   Temp 98.4 F (36.9 C)   Ht 5\' 5"  (1.651 m)   Wt 168 lb (76.2 kg)   SpO2 96%   BMI 27.96 kg/m   Physical Examination:  General: Awake, alert, well nourished, No acute distress HEENT: sclera white, MMM Cardio: regular rate and rhythm, S1S2 heard, no murmurs appreciated Pulm: clear to auscultation bilaterally, no wheezes, rhonchi or rales; normal work of breathing on room air    Assessment/ Plan: 63 y.o. male   Testosterone deficiency - Plan: CMP14+EGFR, Testosterone, CBC, testosterone cypionate (DEPOTESTOSTERONE CYPIONATE) 200 MG/ML injection, ToxASSURE Select 13 (MW), Urine  White coat syndrome with diagnosis of hypertension - Plan: atenolol (TENORMIN) 25 MG tablet  Pure hypercholesterolemia - Plan: Lipid panel, CMP14+EGFR, rosuvastatin (CRESTOR) 40 MG tablet  Vitamin D deficiency - Plan: VITAMIN D 25 Hydroxy (Vit-D Deficiency, Fractures)  Pre-diabetes - Plan: Bayer DCA Hb A1c Waived  Generalized anxiety disorder - Plan: escitalopram (LEXAPRO) 10 MG tablet  Gastroesophageal reflux disease without esophagitis - Plan: omeprazole (PRILOSEC OTC) 20 MG tablet  Check PSA, CBC, testosterone level.  I placed future orders for repeat CBC and testosterone level  to be collected in 6 months along with other fasting labs in anticipation for his CPE.  Will plan to update UDS and CSC at that time as well.  Sadly, there is no way around getting some type of drug screen every year as he is on a controlled substance and this is office policy.  Hopefully urine drug screen will be cheaper than the serum when he had collected  His blood pressure was controlled.  Refill sent on all medications for the patient today.  We did not discuss remainder of issues including vitamin D, prediabetes, GAD or GERD.   Raliegh Ip, DO [facility] [phone]

## 2023-06-07 ENCOUNTER — Encounter: Payer: Self-pay | Admitting: Family Medicine

## 2023-06-07 LAB — TESTOSTERONE: Testosterone: 372 ng/dL (ref 264–916)

## 2023-06-07 LAB — CBC
Hematocrit: 46.6 % (ref 37.5–51.0)
Hemoglobin: 14.6 g/dL (ref 13.0–17.7)
MCH: 27.1 pg (ref 26.6–33.0)
MCHC: 31.3 g/dL — ABNORMAL LOW (ref 31.5–35.7)
MCV: 87 fL (ref 79–97)
Platelets: 191 10*3/uL (ref 150–450)
RBC: 5.39 x10E6/uL (ref 4.14–5.80)
RDW: 14.7 % (ref 11.6–15.4)
WBC: 6.5 10*3/uL (ref 3.4–10.8)

## 2023-06-07 LAB — PSA: Prostate Specific Ag, Serum: 2.6 ng/mL (ref 0.0–4.0)

## 2024-01-02 ENCOUNTER — Other Ambulatory Visit

## 2024-01-02 ENCOUNTER — Ambulatory Visit: Payer: 59 | Admitting: Family Medicine

## 2024-01-02 VITALS — BP 134/76 | HR 74 | Temp 98.5°F | Ht 65.0 in | Wt 163.2 lb

## 2024-01-02 DIAGNOSIS — K219 Gastro-esophageal reflux disease without esophagitis: Secondary | ICD-10-CM

## 2024-01-02 DIAGNOSIS — E349 Endocrine disorder, unspecified: Secondary | ICD-10-CM

## 2024-01-02 DIAGNOSIS — Z0000 Encounter for general adult medical examination without abnormal findings: Secondary | ICD-10-CM

## 2024-01-02 DIAGNOSIS — M10022 Idiopathic gout, left elbow: Secondary | ICD-10-CM | POA: Diagnosis not present

## 2024-01-02 DIAGNOSIS — R002 Palpitations: Secondary | ICD-10-CM

## 2024-01-02 DIAGNOSIS — F411 Generalized anxiety disorder: Secondary | ICD-10-CM

## 2024-01-02 DIAGNOSIS — Z23 Encounter for immunization: Secondary | ICD-10-CM

## 2024-01-02 DIAGNOSIS — R7303 Prediabetes: Secondary | ICD-10-CM | POA: Diagnosis not present

## 2024-01-02 DIAGNOSIS — I10 Essential (primary) hypertension: Secondary | ICD-10-CM

## 2024-01-02 DIAGNOSIS — Z0001 Encounter for general adult medical examination with abnormal findings: Secondary | ICD-10-CM

## 2024-01-02 DIAGNOSIS — N5201 Erectile dysfunction due to arterial insufficiency: Secondary | ICD-10-CM

## 2024-01-02 DIAGNOSIS — E559 Vitamin D deficiency, unspecified: Secondary | ICD-10-CM

## 2024-01-02 DIAGNOSIS — E7800 Pure hypercholesterolemia, unspecified: Secondary | ICD-10-CM

## 2024-01-02 LAB — LIPID PANEL

## 2024-01-02 LAB — BAYER DCA HB A1C WAIVED: HB A1C (BAYER DCA - WAIVED): 5.9 % — ABNORMAL HIGH (ref 4.8–5.6)

## 2024-01-02 MED ORDER — SILDENAFIL CITRATE 100 MG PO TABS: 50.0000 mg | ORAL_TABLET | Freq: Every day | ORAL | 11 refills | Status: AC | PRN

## 2024-01-02 MED ORDER — COLCHICINE 0.6 MG PO TABS: ORAL_TABLET | ORAL | 99 refills | Status: AC

## 2024-01-02 MED ORDER — TESTOSTERONE CYPIONATE 200 MG/ML IM SOLN: 50.0000 mg | INTRAMUSCULAR | 1 refills | Status: AC

## 2024-01-02 MED ORDER — ROSUVASTATIN CALCIUM 40 MG PO TABS: 40.0000 mg | ORAL_TABLET | Freq: Every day | ORAL | 3 refills | Status: AC

## 2024-01-02 MED ORDER — OMEPRAZOLE MAGNESIUM 20 MG PO TBEC: 20.0000 mg | DELAYED_RELEASE_TABLET | Freq: Every day | ORAL | 3 refills | Status: AC

## 2024-01-02 MED ORDER — CELECOXIB 200 MG PO CAPS: 200.0000 mg | ORAL_CAPSULE | Freq: Every day | ORAL | 99 refills | Status: AC | PRN

## 2024-01-02 MED ORDER — ESCITALOPRAM OXALATE 10 MG PO TABS: ORAL_TABLET | ORAL | 3 refills | Status: AC

## 2024-01-02 MED ORDER — ATENOLOL 25 MG PO TABS
25.0000 mg | ORAL_TABLET | Freq: Every day | ORAL | 3 refills | Status: AC
Start: 2024-01-02 — End: ?

## 2024-01-02 NOTE — Progress Notes (Signed)
 Jorge [name] is a 63 y.o. male presents to office today for annual physical exam examination.  He is accompanied today by his wife   History of Present Illness   Overall he reports that he has been doing well.  He did have a gout flare of the left elbow recently but that resolved with daily use of colchicine  after just a couple of days.  After further discussion he actually reports that he does have some issues with his heart rate going up and down and feeling like he has palpitations and this occurs roughly 2-3 times per day.  He has been previously evaluated by cardiology.  He is on atenolol .  He denies any chest pain, shortness of breath or change in exercise tolerance.  No lower extremity edema.  Home blood pressures are typically running 130s to 140s over 70s.  Overall all of his medications are working well for him.  He is amenable to influenza vaccination but declines pneumococcal vaccination today.  Typically he eats what ever his wife cooks for him and that is usually rich in protein with some vegetables and fiber.  He reports limited water intake and primarily drinks 4 Coca-Cola's per day and some type of country time lemonade  Marital status: married, Substance use: none Health Maintenance Due  Topic Date Due   Pneumococcal Vaccine: 50+ Years (1 of 1 - PCV) Never done   COVID-19 Vaccine (4 - 2025-26 season) 12/18/2023   Refills needed today: all  Immunization History  Administered Date(s) Administered   Influenza Split 03/24/2009, 04/06/2011   Influenza, Seasonal, Injecte, Preservative Fre 12/29/2009, 03/28/2012, 01/02/2024   Influenza,inj,Quad PF,6+ Mos 03/29/2013, 02/13/2014, 02/24/2015, 03/02/2016, 02/14/2017, 03/02/2018, 01/18/2019, 02/04/2020, 01/26/2021, 02/16/2022   PFIZER(Purple Top)SARS-COV-2 Vaccination 07/05/2019, 07/30/2019, 03/11/2020   Td 11/17/2010   Tdap 11/17/2010, 11/03/2021   Zoster Recombinant(Shingrix ) 11/18/2022, 06/06/2023   Past Medical History:   Diagnosis Date   Abnormal liver enzymes    Duodenitis    Gout    Hemorrhoids    Hiatal hernia    Hyperlipidemia    Hypertension    Kidney stones    Testosterone  deficiency    Tubular adenoma of colon    Social History   Socioeconomic History   Marital status: Married    Spouse name: Not on file   Number of children: 1   Years of education: Not on file   Highest education level: Associate degree: occupational, Scientist, product/process development, or vocational program  Occupational History   Occupation: Management  Tobacco Use   Smoking status: Never   Smokeless tobacco: Never  Vaping Use   Vaping status: Never Used  Substance and Sexual Activity   Alcohol use: No   Drug use: No   Sexual activity: Not on file  Other Topics Concern   Not on file  Social History Narrative   Not on file   Social Drivers of Health   Financial Resource Strain: Low Risk  (01/02/2024)   Overall Financial Resource Strain (CARDIA)    Difficulty of Paying Living Expenses: Not very hard  Food Insecurity: No Food Insecurity (01/02/2024)   Hunger Vital Sign    Worried About Running Out of Food in the Last Year: Never true    Ran Out of Food in the Last Year: Never true  Transportation Needs: No Transportation Needs (01/02/2024)   PRAPARE - Administrator, Civil Service (Medical): No    Lack of Transportation (Non-Medical): No  Physical Activity: Insufficiently Active (01/02/2024)   Exercise Vital  Sign    Days of Exercise per Week: 3 days    Minutes of Exercise per Session: 20 min  Stress: No Stress Concern Present (01/02/2024)   [employer] of Occupational Health - Occupational Stress Questionnaire    Feeling of Stress: Only a little  Social Connections: Moderately Isolated (01/02/2024)   Social Connection and Isolation Panel    Frequency of Communication with Friends and Family: More than three times a week    Frequency of Social Gatherings with Friends and Family: Once a week    Attends Religious  Services: Patient declined    Database administrator or Organizations: No    Attends Engineer, structural: Not on file    Marital Status: Married  Catering manager Violence: Not on file   Past Surgical History:  Procedure Laterality Date   COLONOSCOPY  01/28/2006   Dr. Albertus   LITHOTRIPSY     VASECTOMY     Family History  Problem Relation Age of Onset   Heart disease Mother 44       Heart Valve Disease   Hypertension Mother    Diabetes Father    Colon cancer Father    Stomach cancer Neg Hx    Rectal cancer Neg Hx    Esophageal cancer Neg Hx     Current Outpatient Medications:    cholecalciferol (VITAMIN D ) 1000 UNITS tablet, Take 2,000 Units by mouth daily., Disp: , Rfl:    Multiple Vitamins-Minerals (MULTIVITAMIN ADULT PO), Take by mouth., Disp: , Rfl:    Turmeric (QC TUMERIC COMPLEX PO), Take by mouth., Disp: , Rfl:    vitamin C (ASCORBIC ACID) 500 MG tablet, Take 500 mg by mouth daily., Disp: , Rfl:    atenolol  (TENORMIN ) 25 MG tablet, Take 1 tablet (25 mg total) by mouth daily., Disp: 100 tablet, Rfl: 3   celecoxib  (CELEBREX ) 200 MG capsule, Take 1 capsule (200 mg total) by mouth daily as needed (gout flare/ pain)., Disp: 30 capsule, Rfl: PRN   colchicine  0.6 MG tablet, Take 2 tablets at onset of gout flare.  Then take 1 tablet daily until gout flare resolved., Disp: 30 tablet, Rfl: PRN   escitalopram  (LEXAPRO ) 10 MG tablet, TAKE ONE (1) TABLET BY MOUTH EVERY DAY, Disp: 100 tablet, Rfl: 3   omeprazole  (PRILOSEC  OTC) 20 MG tablet, Take 1 tablet (20 mg total) by mouth daily., Disp: 100 tablet, Rfl: 3   rosuvastatin  (CRESTOR ) 40 MG tablet, Take 1 tablet (40 mg total) by mouth daily. TAKE ONE (1) TABLET EACH DAY, Disp: 100 tablet, Rfl: 3   sildenafil  (VIAGRA ) 100 MG tablet, Take 0.5-1 tablets (50-100 mg total) by mouth daily as needed for erectile dysfunction., Disp: 5 tablet, Rfl: 11   testosterone  cypionate (DEPOTESTOSTERONE CYPIONATE) 200 MG/ML injection, Inject 0.25  mLs (50 mg total) into the muscle every 7 (seven) days., Disp: 10 mL, Rfl: 1  Allergies  Allergen Reactions   Penicillins Rash     ROS: Review of Systems Pertinent items noted in HPI and remainder of comprehensive ROS otherwise negative.    Physical exam BP 134/76 Comment: home BP  Pulse 74   Temp 98.5 F (36.9 C)   Ht 5' 5 (1.651 m)   Wt 163 lb 4 oz (74 kg)   SpO2 97%   BMI 27.17 kg/m  General appearance: alert, cooperative, appears stated age, and no distress Head: Normocephalic, without obvious abnormality, atraumatic Eyes: negative findings: lids and lashes normal, conjunctivae and sclerae normal, corneas clear, and  pupils equal, round, reactive to light and accomodation Ears: normal TM's and external ear canals both ears Nose: Nares normal. Septum midline. Mucosa normal. No drainage or sinus tenderness. Throat: lips, mucosa, and tongue normal; teeth and gums normal Neck: no adenopathy, no carotid bruit, supple, symmetrical, trachea midline, and thyroid  not enlarged, symmetric, no tenderness/mass/nodules Back: symmetric, no curvature. ROM normal. No CVA tenderness. Lungs: clear to auscultation bilaterally Chest wall: no tenderness Heart: regular rate and rhythm, S1, S2 normal, no murmur, click, rub or gallop.  He has occasional extra beat Abdomen: soft, non-tender; bowel sounds normal; no masses,  no organomegaly Extremities: extremities normal, atraumatic, no cyanosis or edema Pulses: 2+ and symmetric Skin: Multiple pigmented nevi and what appear to be solar lentigo along the upper extremities.  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 Neurologic: Grossly normal      06/06/2023    9:13 AM 11/18/2022    8:00 AM 12/24/2021    3:13 PM  Depression screen PHQ 2/9  Decreased Interest 0 0 0  Down, Depressed, Hopeless 0 0 0  PHQ - 2 Score 0 0 0  Altered sleeping 0 0 0   Tired, decreased energy 0 0 0  Change in appetite 0 0 0  Feeling bad or failure about yourself  0 0 0  Trouble concentrating 0 0 0  Moving slowly or fidgety/restless 0 0 0  Suicidal thoughts 0 0 0  PHQ-9 Score 0 0 0  Difficult doing work/chores Not difficult at all Not difficult at all Not difficult at all      06/06/2023    9:13 AM 11/18/2022    8:00 AM 12/24/2021    3:13 PM 11/29/2021    1:55 PM  GAD 7 : Generalized Anxiety Score  Nervous, Anxious, on Edge 0 0 0 0  Control/stop worrying 0 0 0 0  Worry too much - different things 0 0 0 0  Trouble relaxing 0 0 0 0  Restless 0 0 0 0  Easily annoyed or irritable 0 0 0 0  Afraid - awful might happen 0 0 0 0  Total GAD 7 Score 0 0 0 0  Anxiety Difficulty Not difficult at all Not difficult at all Not difficult at all Not difficult at all     Assessment/ Plan: Jorge [name] here for annual physical exam.  Assessment & Plan    Annual physical exam  Heart palpitations - Plan: LONG TERM MONITOR XT (3-14 DAYS)  Testosterone  deficiency - Plan: CBC, Testosterone , CMP14+EGFR, ToxASSURE Select 13 (MW), Urine, testosterone  cypionate (DEPOTESTOSTERONE CYPIONATE) 200 MG/ML injection  Acute idiopathic gout of left elbow - Plan: celecoxib  (CELEBREX ) 200 MG capsule, colchicine  0.6 MG tablet  White coat syndrome with diagnosis of hypertension - Plan: atenolol  (TENORMIN ) 25 MG tablet  Pure hypercholesterolemia - Plan: CMP14+EGFR, Lipid panel, rosuvastatin  (CRESTOR ) 40 MG tablet  Vitamin D  deficiency - Plan: VITAMIN D  25 Hydroxy (Vit-D Deficiency, Fractures)  Pre-diabetes - Plan: Bayer DCA Hb A1c Waived  Generalized anxiety disorder - Plan: escitalopram  (LEXAPRO ) 10 MG tablet  Gastroesophageal reflux disease without esophagitis - Plan: omeprazole  (PRILOSEC  OTC) 20 MG tablet  Erectile dysfunction due to arterial insufficiency - Plan: sildenafil  (VIAGRA ) 100 MG tablet  Encounter for immunization - Plan: Flu vaccine trivalent PF, 6mos and  older(Flulaval,Afluria,Fluarix,Fluzone)  Given his intermittent heart palpitations that are occurring frequently I have placed him on a Zio patch  and we will plan to either change his atenolol  and or refer to cardiology pending the results.  We discussed reducing caffeine intake and he is going to try reducing his Coke intake by at least 50% and replacing this with water  Nonfasting labs collected in a.m. testosterone .  He had his UDS and CSA updated today as per office policy and the national narcotic database was reviewed with no red flags.  Testosterone  renewed  As he had a recent gout flare I renewed his Celebrex  and colchicine  to use for as needed  Blood pressure is controlled at home.  No changes for now  Discussed that A1c remains in prediabetic range and that reduction of sugar absolutely recommended given strong family history of type 2 diabetes  Lexapro  working well for GAD  PPI renewed as GERD is stable  ED stable with as needed use of Viagra   Influenza vaccination administered but he declined pneumococcal vaccine  Counseled on healthy lifestyle choices, including diet (rich in fruits, vegetables and lean meats and low in salt and simple carbohydrates) and exercise (at least 30 minutes of moderate physical activity daily).  Patient to follow up in 6 months for testosterone  but may be sooner if we have to change his beta-blocker  Marilena Trevathan M. Jolinda, DO

## 2024-01-02 NOTE — Patient Instructions (Signed)
 Palpitations Palpitations are feelings that your heartbeat is not normal. Your heartbeat may feel like it is: Uneven (irregular). Faster than normal. Fluttering. Skipping a beat. This is usually not a serious problem. However, a doctor will do tests and check your medical history to make sure that you do not have a serious heart problem. Follow these instructions at home: Watch for any changes in your condition. Tell your doctor about any changes. Take these actions to help manage your symptoms: Eating and drinking Follow instructions from your doctor about things to eat and drink. You may be told to avoid these things: Drinks that have caffeine in them, such as coffee, tea, soft drinks, and energy drinks. Chocolate. Alcohol. Diet pills. Lifestyle     Try to lower your stress. These things can help you relax: Yoga. Deep breathing and meditation. Guided imagery. This is using words and images to create positive thoughts. Exercise, including swimming, jogging, and walking. Tell your doctor if you have more abnormal heartbeats when you are active. If you have chest pain or feel short of breath with exercise, do not keep doing the exercise until you are seen by your doctor. Biofeedback. This is using your mind to control things in your body, such as your heartbeat. Get plenty of rest and sleep. Keep a regular bed time. Do not use drugs, such as cocaine or ecstasy. Do not use marijuana. Do not smoke or use any products that contain nicotine or tobacco. If you need help quitting, ask your doctor. General instructions Take over-the-counter and prescription medicines only as told by your doctor. Keep all follow-up visits. You may need more tests if palpitations do not go away or get worse. Contact a doctor if: You keep having fast or uneven heartbeats for a long time. Your symptoms happen more often. Get help right away if: You have chest pain. You feel short of breath. You have a very  bad headache. You feel dizzy. You faint. These symptoms may be an emergency. Get help right away. Call your local emergency services (911 in the [place] Do not wait to see if the symptoms will go away. Do not drive yourself to the hospital. Summary Palpitations are feelings that your heartbeat is uneven or faster than normal. It may feel like your heart is fluttering or skipping a beat. Avoid food and drinks that may cause this condition. These include caffeine, chocolate, and alcohol. Try to lower your stress. Do not smoke or use drugs. Get help right away if you faint, feel dizzy, feel short of breath, have chest pain, or have a very bad headache. This information is not intended to replace advice given to you by your health care provider. Make sure you discuss any questions you have with your health care provider. Document Revised: 08/26/2020 Document Reviewed: 08/26/2020 Elsevier Patient Education  2024 [employer].

## 2024-01-03 ENCOUNTER — Ambulatory Visit: Payer: Self-pay | Admitting: Family Medicine

## 2024-01-03 LAB — CMP14+EGFR
ALT: 32 IU/L (ref 0–44)
AST: 26 IU/L (ref 0–40)
Albumin: 4.1 g/dL (ref 3.9–4.9)
Alkaline Phosphatase: 68 IU/L (ref 47–123)
BUN/Creatinine Ratio: 10 (ref 10–24)
BUN: 10 mg/dL (ref 8–27)
Bilirubin Total: 0.3 mg/dL (ref 0.0–1.2)
CO2: 23 mmol/L (ref 20–29)
Calcium: 8.9 mg/dL (ref 8.6–10.2)
Chloride: 103 mmol/L (ref 96–106)
Creatinine, Ser: 1.05 mg/dL (ref 0.76–1.27)
Globulin, Total: 2.4 g/dL (ref 1.5–4.5)
Glucose: 173 mg/dL — ABNORMAL HIGH (ref 70–99)
Potassium: 4.9 mmol/L (ref 3.5–5.2)
Sodium: 139 mmol/L (ref 134–144)
Total Protein: 6.5 g/dL (ref 6.0–8.5)
eGFR: 81 mL/min/1.73 (ref >59)

## 2024-01-03 LAB — LIPID PANEL
Cholesterol, Total: 91 mg/dL — AB (ref 100–199)
HDL: 32 mg/dL — ABNORMAL LOW (ref >39)
LDL CALC COMMENT:: 2.8 ratio (ref 0.0–5.0)
LDL Chol Calc (NIH): 25 mg/dL (ref 0–99)
Triglycerides: 221 mg/dL — ABNORMAL HIGH (ref 0–149)
VLDL Cholesterol Cal: 34 mg/dL (ref 5–40)

## 2024-01-03 LAB — VITAMIN D 25 HYDROXY (VIT D DEFICIENCY, FRACTURES): Vit D, 25-Hydroxy: 36 ng/mL (ref 30.0–100.0)

## 2024-01-03 LAB — CBC
Hematocrit: 44 % (ref 37.5–51.0)
Hemoglobin: 13.4 g/dL (ref 13.0–17.7)
MCH: 26 pg — ABNORMAL LOW (ref 26.6–33.0)
MCHC: 30.5 g/dL — ABNORMAL LOW (ref 31.5–35.7)
MCV: 85 fL (ref 79–97)
Platelets: 207 x10E3/uL (ref 150–450)
RBC: 5.16 x10E6/uL (ref 4.14–5.80)
RDW: 14.9 % (ref 11.6–15.4)
WBC: 6.3 x10E3/uL (ref 3.4–10.8)

## 2024-01-03 LAB — TESTOSTERONE: Testosterone: 228 ng/dL — AB (ref 264–916)

## 2024-01-05 LAB — TOXASSURE SELECT 13 (MW), URINE

## 2024-01-22 ENCOUNTER — Ambulatory Visit: Payer: Self-pay | Admitting: Family Medicine

## 2024-07-01 ENCOUNTER — Ambulatory Visit: Payer: Self-pay | Admitting: Family Medicine
